# Patient Record
Sex: Male | Born: 1965 | Race: White | Hispanic: No | Marital: Married | Smoking: Never smoker
Health system: Southern US, Community
[De-identification: ages and names within clinical notes are randomized; demographics above are authoritative.]

## PROBLEM LIST (undated history)

## (undated) DIAGNOSIS — M5020 Other cervical disc displacement, unspecified cervical region: Secondary | ICD-10-CM

## (undated) DIAGNOSIS — M1990 Unspecified osteoarthritis, unspecified site: Secondary | ICD-10-CM

## (undated) DIAGNOSIS — E785 Hyperlipidemia, unspecified: Secondary | ICD-10-CM

## (undated) DIAGNOSIS — M109 Gout, unspecified: Secondary | ICD-10-CM

## (undated) DIAGNOSIS — K429 Umbilical hernia without obstruction or gangrene: Secondary | ICD-10-CM

## (undated) DIAGNOSIS — K8689 Other specified diseases of pancreas: Secondary | ICD-10-CM

## (undated) DIAGNOSIS — E119 Type 2 diabetes mellitus without complications: Secondary | ICD-10-CM

## (undated) DIAGNOSIS — B019 Varicella without complication: Secondary | ICD-10-CM

## (undated) HISTORY — DX: Hyperlipidemia, unspecified: E78.5

## (undated) HISTORY — DX: Type 2 diabetes mellitus without complications: E11.9

## (undated) HISTORY — DX: Unspecified osteoarthritis, unspecified site: M19.90

## (undated) HISTORY — PX: UPPER GASTROINTESTINAL ENDOSCOPY: SHX188

## (undated) HISTORY — DX: Gout, unspecified: M10.9

## (undated) HISTORY — DX: Varicella without complication: B01.9

## (undated) HISTORY — PX: COLONOSCOPY: SHX174

## (undated) HISTORY — DX: Other cervical disc displacement, unspecified cervical region: M50.20

---
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
## 2000-06-27 HISTORY — PX: CHOLECYSTECTOMY: SHX55

## 2004-07-27 ENCOUNTER — Ambulatory Visit: Payer: Self-pay

## 2006-06-28 ENCOUNTER — Ambulatory Visit: Payer: Self-pay | Admitting: Family Medicine

## 2006-06-30 ENCOUNTER — Ambulatory Visit: Payer: Self-pay | Admitting: Family Medicine

## 2006-07-18 ENCOUNTER — Ambulatory Visit: Payer: Self-pay | Admitting: Gastroenterology

## 2006-09-13 ENCOUNTER — Ambulatory Visit: Payer: Self-pay | Admitting: Gastroenterology

## 2006-09-26 ENCOUNTER — Ambulatory Visit: Payer: Self-pay | Admitting: Gastroenterology

## 2007-05-18 ENCOUNTER — Emergency Department: Payer: Self-pay | Admitting: Emergency Medicine

## 2007-05-29 ENCOUNTER — Emergency Department: Payer: Self-pay | Admitting: Emergency Medicine

## 2008-01-23 ENCOUNTER — Ambulatory Visit: Payer: Self-pay | Admitting: Orthopedic Surgery

## 2008-03-28 IMAGING — CT CT ABD-PELV W/ CM
1 of 2 series · 15 of 32 positions shown, 19 images · non-contrast
Comparison: none

REASON FOR EXAM: (1) diffuse pain with fever; (2) diffuse pain with fever
COMMENTS:

[Series 2: abd/pelvis · axial · 0.87mm/px · z∈[-546,-72]mm · 15 of 174 slices shown, 19 images]
[im 8/174  soft-tissue]
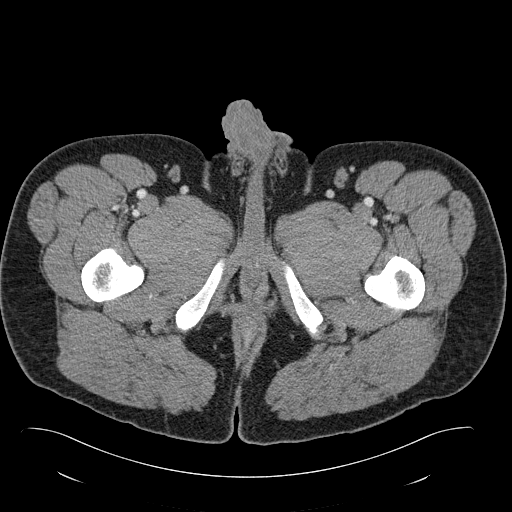
[im 8/174  bone]
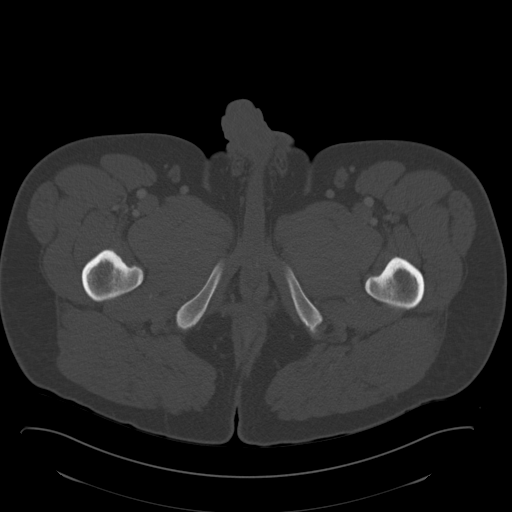
[im 23/174  soft-tissue]
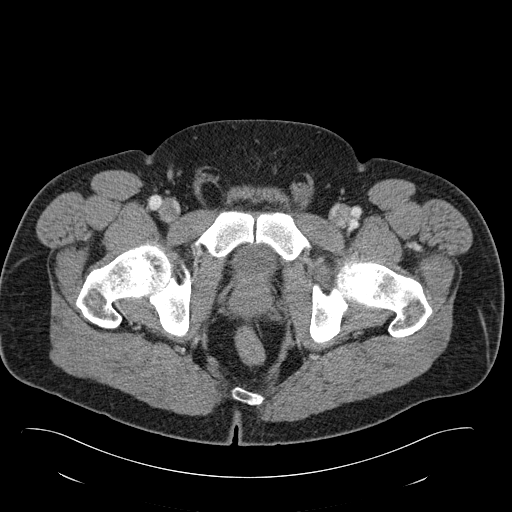
[im 38/174  soft-tissue]
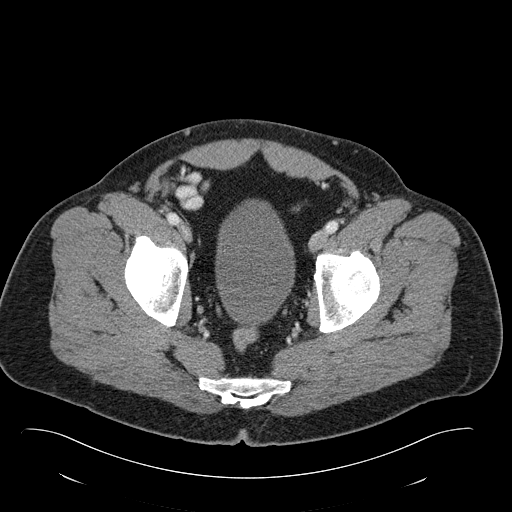
[im 46/174  soft-tissue]
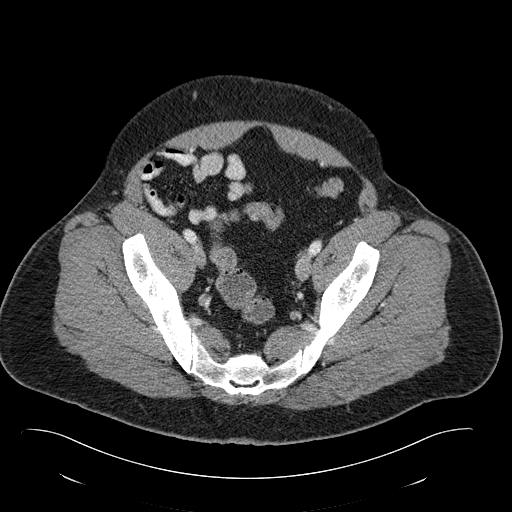
[im 61/174  soft-tissue]
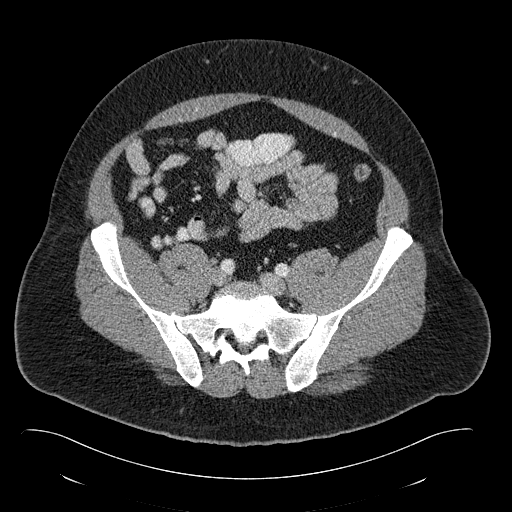
[im 76/174  soft-tissue]
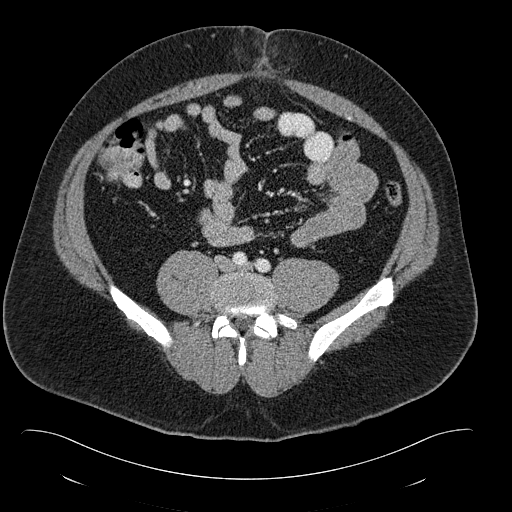
[im 91/174  soft-tissue]
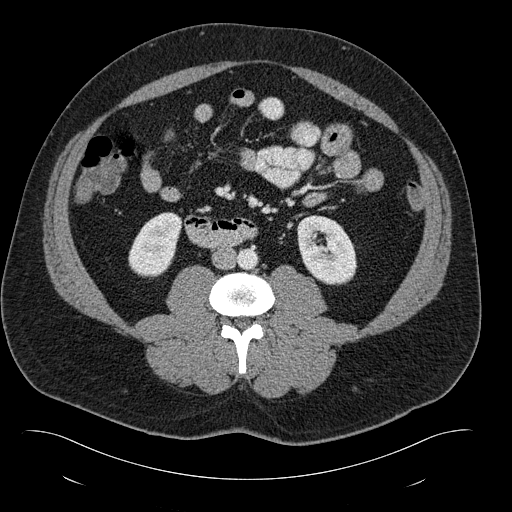
[im 98/174  soft-tissue]
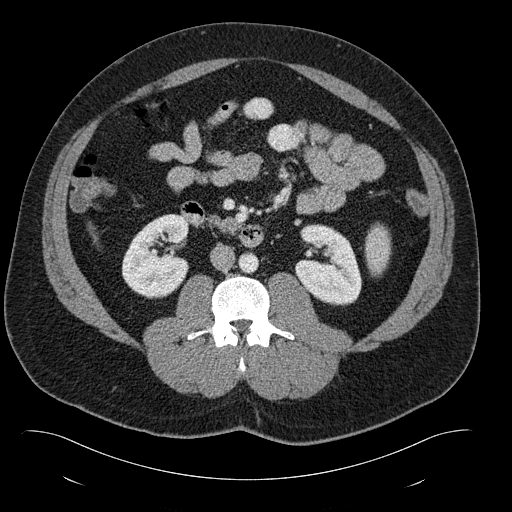
[im 113/174  soft-tissue]
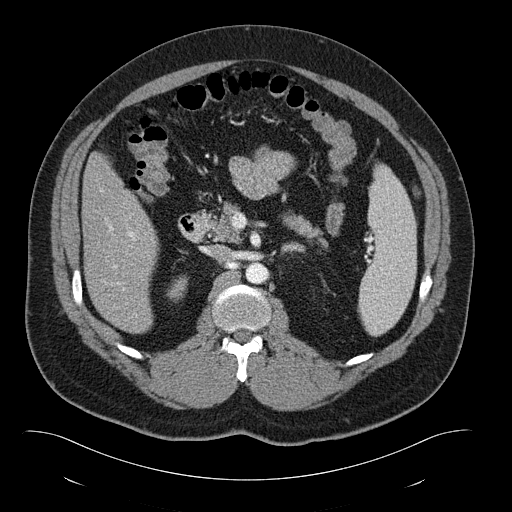
[im 113/174  bone]
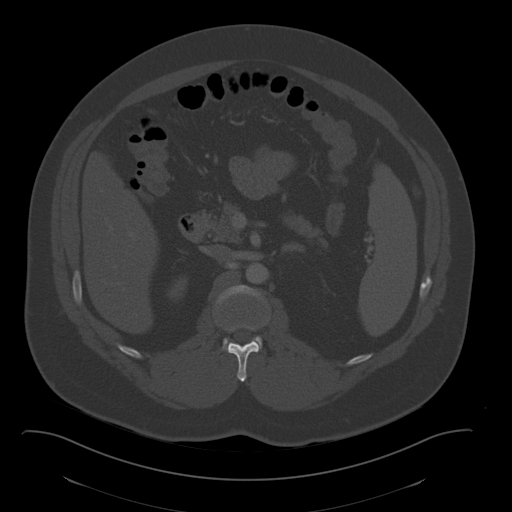
[im 128/174  soft-tissue]
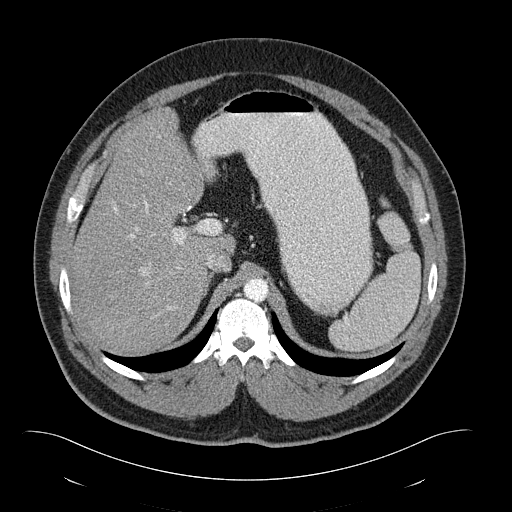
[im 136/174  soft-tissue]
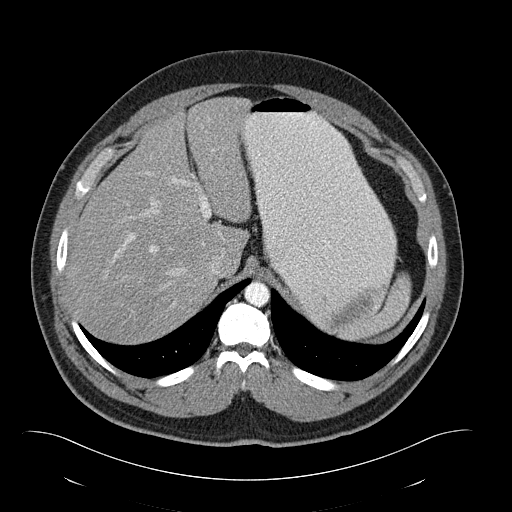
[im 143/174  lung]
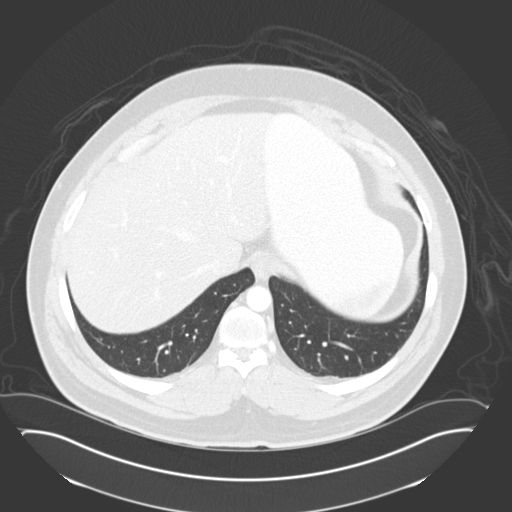
[im 151/174  soft-tissue]
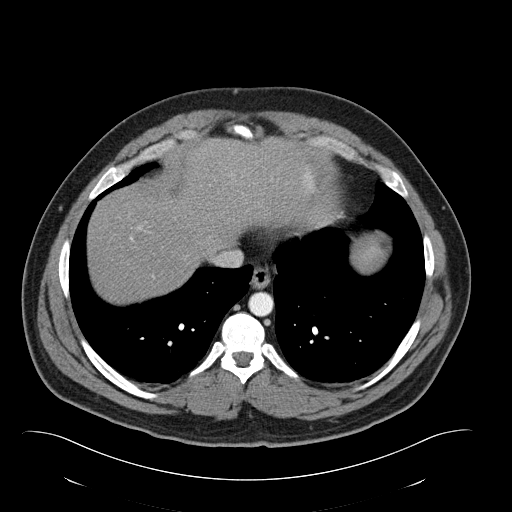
[im 151/174  lung]
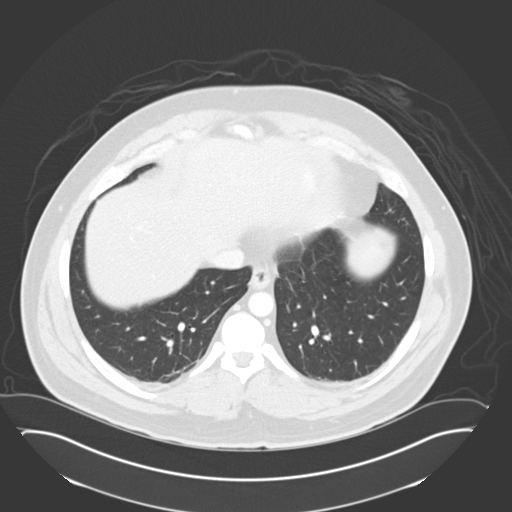
[im 158/174  lung]
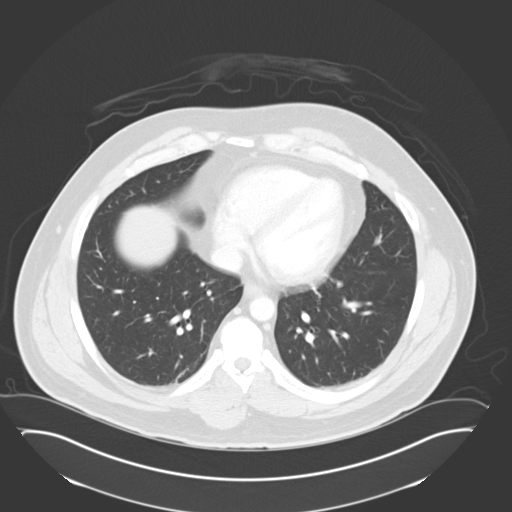
[im 166/174  soft-tissue]
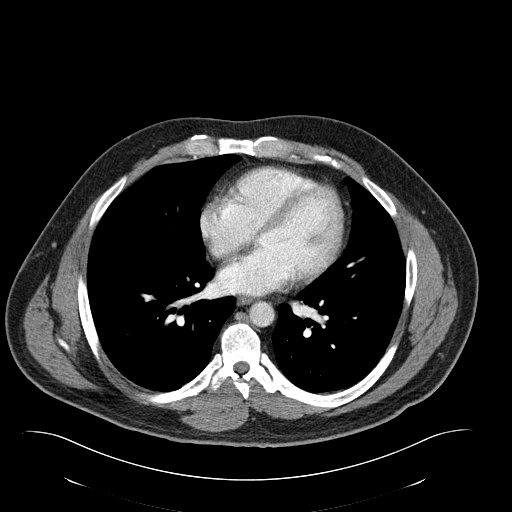
[im 166/174  lung]
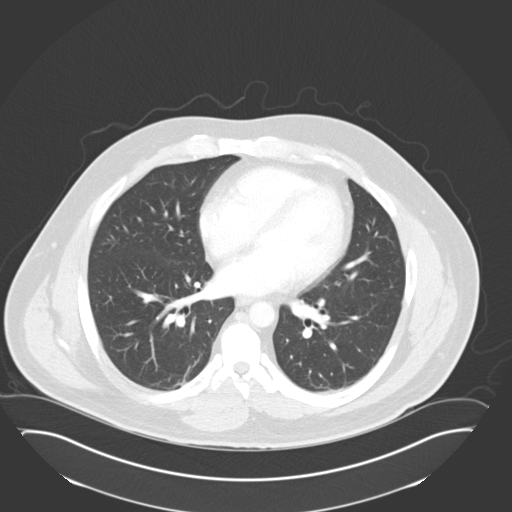

[15 of 32 positions shown; findings below may reference images not displayed]

PROCEDURE:     CT  - CT ABDOMEN / PELVIS  W  - May 30, 2007 [DATE]

RESULT:     Helical 3 mm sections were obtained from the lung bases through
the pubic symphysis status post intravenous administration of 125 ml of
Isovue 370 and oral contrast.

The lung bases demonstrate mild hypoventilation. (This study was compared to
a previous study dated 09-26-06).

The liver, spleen, adrenals, pancreas and kidneys are unremarkable. The
patient is status post cholecystectomy. The celiac, portal vein, SMV, SMA
and IMA are opacified. There is no evidence of an abdominal aortic aneurysm.
There is no evidence of abdominal or pelvic masses, free fluid, drainable
loculated fluid collections or adenopathy. A small fat containing umbilical
hernia is appreciated demonstrating a mild amount of standing.
IMPRESSION: 1.Small umbilical hernia. Otherwise, no evidence of focal or acute
abnormalities.  Specifically, there is no CT evidence of bowel obstruction,
diverticulitis, colitis, enteritis or appendicitis.

Dr. Leclerc of the Emergency Room was informed of these findings via a
preliminary faxed report on 05-30-07 at [DATE] Central Time.

## 2010-06-27 HISTORY — PX: BACK SURGERY: SHX140

## 2015-03-15 ENCOUNTER — Emergency Department
Admission: EM | Admit: 2015-03-15 | Discharge: 2015-03-15 | Disposition: A | Discharge status: Home / Self Care | Payer: Federal, State, Local not specified - PPO | Attending: Emergency Medicine | Admitting: Emergency Medicine

## 2015-03-15 ENCOUNTER — Emergency Department: Payer: Federal, State, Local not specified - PPO

## 2015-03-15 DIAGNOSIS — M10071 Idiopathic gout, right ankle and foot: Secondary | ICD-10-CM | POA: Insufficient documentation

## 2015-03-15 DIAGNOSIS — M109 Gout, unspecified: Secondary | ICD-10-CM

## 2015-03-15 DIAGNOSIS — M79671 Pain in right foot: Secondary | ICD-10-CM | POA: Diagnosis present

## 2015-03-15 DIAGNOSIS — E119 Type 2 diabetes mellitus without complications: Secondary | ICD-10-CM | POA: Diagnosis not present

## 2015-03-15 MED ORDER — KETOROLAC TROMETHAMINE 60 MG/2ML IM SOLN
60.0000 mg | Freq: Once | INTRAMUSCULAR | Status: AC
  Administered 2015-03-15: 60 mg via INTRAMUSCULAR
  Filled 2015-03-15: qty 2

## 2015-03-15 MED ORDER — HYDROCODONE-ACETAMINOPHEN 5-325 MG PO TABS
1.0000 | ORAL_TABLET | Freq: Once | ORAL | Status: AC
Start: 2015-03-15 — End: 2015-03-15
  Administered 2015-03-15: 1 via ORAL
  Filled 2015-03-15: qty 1

## 2015-03-15 MED ORDER — OXYCODONE-ACETAMINOPHEN 5-325 MG PO TABS
1.0000 | ORAL_TABLET | Freq: Once | ORAL | Status: AC
  Administered 2015-03-15: 1 via ORAL

## 2015-03-15 MED ORDER — PREDNISONE 20 MG PO TABS: 20.0000 mg | ORAL_TABLET | Freq: Every day | ORAL | Status: DC

## 2015-03-15 MED ORDER — PREDNISONE 20 MG PO TABS
20.0000 mg | ORAL_TABLET | Freq: Once | ORAL | Status: AC
  Administered 2015-03-15: 20 mg via ORAL
  Filled 2015-03-15: qty 1

## 2015-03-15 MED ORDER — OXYCODONE-ACETAMINOPHEN 5-325 MG PO TABS
ORAL_TABLET | ORAL | Status: AC
  Filled 2015-03-15: qty 1

## 2015-03-15 MED ORDER — OXYCODONE-ACETAMINOPHEN 5-325 MG PO TABS: 1.0000 | ORAL_TABLET | Freq: Four times a day (QID) | ORAL | Status: DC | PRN

## 2015-03-15 NOTE — ED Provider Notes (Signed)
[facility] Emergency Department Provider Note REMINDER - THIS NOTE IS NOT A FINAL MEDICAL RECORD UNTIL IT IS SIGNED. UNTIL THEN, THE CONTENT BELOW MAY REFLECT INFORMATION FROM A DOCUMENTATION TEMPLATE, NOT THE ACTUAL PATIENT VISIT. ____________________________________________  Time seen: Approximately 2:48 AM  I have reviewed the triage vital signs and the nursing notes.   HISTORY  Chief Complaint Foot Pain    HPI Jermaine Ford is a 49 y.o. male with medication control diabetes and gout. He reports that he began having a gout flare approximately 4 days ago for which she took colchicine due to pain in the right foot which improved his symptoms, but they have returned. He reports pain with walking in his right mid foot and right ankle. No numbness or tingling. No weakness. He denies fever or chills but does report that a low-grade temperature was noted at triage.   The patient reports that he has had very frequent gout flares, he is been experiencing increasing numbers and has been having one flare approximately every month now. This seems similar to previous flares.  No recent surgeries. No procedures to the right leg or ankle. No history of infection. Does not take any immune suppressants.   No past medical history on file. Diabetes Gout  There are no active problems to display for this patient.   No past surgical history on file.  Current Outpatient Rx  Name  Route  Sig  Dispense  Refill  . oxyCODONE-acetaminophen (ROXICET) 5-325 MG per tablet   Oral   Take 1 tablet by mouth every 6 (six) hours as needed for severe pain.   20 tablet   0   . predniSONE (DELTASONE) 20 MG tablet   Oral   Take 1 tablet (20 mg total) by mouth daily with breakfast.   5 tablet   0     Allergies Review of patient's allergies indicates no known allergies.  No family history on file.  Social History Social History  Substance Use Topics  . Smoking status: Not  on file  . Smokeless tobacco: Not on file  . Alcohol Use: Not on file    Review of Systems Constitutional: No fever/chills Eyes: No visual changes. ENT: No sore throat. Cardiovascular: Denies chest pain. Respiratory: Denies shortness of breath. Gastrointestinal: No abdominal pain.  No nausea, no vomiting.  No diarrhea.  No constipation. Genitourinary: Negative for dysuria. Musculoskeletal: Negative for back pain. Skin: Negative for rash. Neurological: Negative for headaches, focal weakness or numbness.  10-point ROS otherwise negative.  ____________________________________________   PHYSICAL EXAM:  VITAL SIGNS: ED Triage Vitals  Enc Vitals Group     BP 03/15/15 0051 139/76 mmHg     Pulse Rate 03/15/15 0051 94     Resp 03/15/15 0051 18     Temp 03/15/15 0051 100.3 F (37.9 C)     Temp Source 03/15/15 0051 Oral     SpO2 03/15/15 0051 95 %     Weight 03/15/15 0051 255 lb (115.667 kg)     Height 03/15/15 0051 5\' 10"  (1.778 m)     Head Cir --      Peak Flow --      Pain Score 03/15/15 0052 10     Pain Loc --      Pain Edu? --      Excl. in [place]? --    Constitutional: Alert and oriented. Well appearing and in no acute distress. Eyes: Conjunctivae are normal. PERRL. EOMI. Head: Atraumatic. Nose: No  congestion/rhinnorhea. Mouth/Throat: Mucous membranes are moist.  Oropharynx non-erythematous. Neck: No stridor.   Cardiovascular: Normal rate, regular rhythm. Grossly normal heart sounds.  Good peripheral circulation. Respiratory: Normal respiratory effort.  No retractions. Lungs CTAB. Gastrointestinal: Soft and nontender. No distention. No abdominal bruits. No CVA tenderness. Musculoskeletal: No lower extremity tenderness nor edema except as noted.  No joint effusions, though there is tenderness about the right dorsal mid foot and over the right ankle joint. There is no overlying erythema. The right ankle joint does seem slightly warm. Dorsalis pedis and posterior tibial pulses  and capillary refill are very normal in both extremities. The right knee and leg are otherwise normal. There is moderate tenderness to palpation of the right ankle. Neurologic:  Normal speech and language. No gross focal neurologic deficits are appreciated. Skin:  Skin is warm, dry and intact. No rash noted. Psychiatric: Mood and affect are normal. Speech and behavior are normal.  ____________________________________________   LABS (all labs ordered are listed, but only abnormal results are displayed)  Labs Reviewed - No data to display ____________________________________________  EKG   ____________________________________________  RADIOLOGY  IMPRESSION: Findings consistent with gout involving the first metatarsal phalangeal joint. Additionally there is mild hallux valgus and first MTP degenerative change. ____________________________________________   PROCEDURES  Procedure(s) performed: None   Review of risks and benefits of arthrocentesis of the right ankle joint to rule out a "infection". After discussion of the benefits and risks, patient and his wife both agree they do not wish to have arthrocentesis performed.  Critical Care performed: No  ____________________________________________   INITIAL IMPRESSION / ASSESSMENT AND PLAN / ED COURSE  Pertinent labs & imaging results that were available during my care of the patient were reviewed by me and considered in my medical decision making (see chart for details).  Patient presents with acute right ankle and midfoot pain. Neurovascular exam is normal. Patient does have warmth and tenderness isolated to the right dorsal mid foot but primarily seated upon the right ankle joint. Differential diagnosis certainly includes gout, septic joint, crystal arthropathy. No trauma.  I did discuss with the patient that although his symptoms seem most consistent with gout, we cannot completely exclude infection as a cause though he does  not have any frank reason to have an infected ankle joint (not on immunosuppressant (except chochicine which started taking after pain started), no recent surgery, etc.) though he does have a low-grade fever, but this can also be seen with gout flares. After discussing the risks and benefits of arthrocentesis and reviewing the basics of the procedure, with shared medical decision making we will not perform this but the patient will return to the emergency room and seek medical treatment right away if his symptoms are worsening, he develops higher fevers or shaking chills, notices redness or significant increase in swelling at the joint, or if other new concerns arise.  I will prescribe the patient a narcotic pain medicine due to their condition which I anticipate will cause at least moderate pain short term. I discussed with the patient safe use of narcotic pain medicines, and that they are not to drive, work in dangerous areas, or ever take more than prescribed (no more than 1 pill every 6 hours). We discussed that this is the type of medication that can be overdosed on and the risks of this type of medicine. Patient is very agreeable to only use as prescribed and to never use more than prescribed.  ----------------------------------------- 4:34 AM on 03/15/2015 -----------------------------------------  Patient reports pain improved. Very careful return precautions advised and I especially reinforced that we cannot exclude an infection of the right ankle, though given his history of frequent gout flares which are progressive in nature over the last several months it would seem most likely gout at this time. Patient wife both very agreeable and he'll return to the emergency room right away should his symptoms not be improving rapidly, if he develops high fevers or chills, increased swelling or pain, or other new concerns arise.  ____________________________________________   FINAL CLINICAL  IMPRESSION(S) / ED DIAGNOSES  Final diagnoses:  Acute gout of right ankle, unspecified cause      Delman Kitten, MD 03/15/15 [phone]

## 2015-03-15 NOTE — ED Notes (Signed)
Pt c/o pain to right foot since Saturday morning when he woke; denies injury; pain to outer mid foot radiating up to ankle;

## 2015-03-15 NOTE — Discharge Instructions (Signed)
As we discussed, given you have a long-standing history of gout would seem this is most likely due to a flare of your gout, but infection is a consideration. Should your symptoms worsen, you have noticed the joint becomes more red or swollen, you are not feeling improved after 24 hours of treatment, you have developed shaking chills, nausea, vomiting, a fever of 101F or greater, or other new concerns arise it would be very important to come back to the emergency room and consider having fluid removed from the ankle to rule out infection right away.  Please do not drive this evening or while taking Percocet. Follow up closely with the [facility] walk-in clinic or the emergency room in 1-2 days for reevaluation of your ankle and symptoms. I also strongly advise you to follow up with rheumatology as soon as possible to minimize your chances of recurrence.   Gout Gout is an inflammatory arthritis caused by a buildup of uric acid crystals in the joints. Uric acid is a chemical that is normally present in the blood. When the level of uric acid in the blood is too high it can form crystals that deposit in your joints and tissues. This causes joint redness, soreness, and swelling (inflammation). Repeat attacks are common. Over time, uric acid crystals can form into masses (tophi) near a joint, destroying bone and causing disfigurement. Gout is treatable and often preventable. CAUSES  The disease begins with elevated levels of uric acid in the blood. Uric acid is produced by your body when it breaks down a naturally found substance called purines. Certain foods you eat, such as meats and fish, contain high amounts of purines. Causes of an elevated uric acid level include:  Being passed down from parent to child (heredity).  Diseases that cause increased uric acid production (such as obesity, psoriasis, and certain cancers).  Excessive alcohol use.  Diet, especially diets rich in meat and seafood.  Medicines,  including certain cancer-fighting medicines (chemotherapy), water pills (diuretics), and aspirin.  Chronic kidney disease. The kidneys are no longer able to remove uric acid well.  Problems with metabolism. Conditions strongly associated with gout include:  Obesity.  High blood pressure.  High cholesterol.  Diabetes. Not everyone with elevated uric acid levels gets gout. It is not understood why some people get gout and others do not. Surgery, joint injury, and eating too much of certain foods are some of the factors that can lead to gout attacks. SYMPTOMS   An attack of gout comes on quickly. It causes intense pain with redness, swelling, and warmth in a joint.  Fever can occur.  Often, only one joint is involved. Certain joints are more commonly involved:  Base of the big toe.  Knee.  Ankle.  Wrist.  Finger. Without treatment, an attack usually goes away in a few days to weeks. Between attacks, you usually will not have symptoms, which is different from many other forms of arthritis. DIAGNOSIS  Your caregiver will suspect gout based on your symptoms and exam. In some cases, tests may be recommended. The tests may include:  Blood tests.  Urine tests.  X-rays.  Joint fluid exam. This exam requires a needle to remove fluid from the joint (arthrocentesis). Using a microscope, gout is confirmed when uric acid crystals are seen in the joint fluid. TREATMENT  There are two phases to gout treatment: treating the sudden onset (acute) attack and preventing attacks (prophylaxis).  Treatment of an Acute Attack.  Medicines are used. These include  anti-inflammatory medicines or steroid medicines.  An injection of steroid medicine into the affected joint is sometimes necessary.  The painful joint is rested. Movement can worsen the arthritis.  You may use warm or cold treatments on painful joints, depending which works best for you.  Treatment to Prevent Attacks.  If you  suffer from frequent gout attacks, your caregiver may advise preventive medicine. These medicines are started after the acute attack subsides. These medicines either help your kidneys eliminate uric acid from your body or decrease your uric acid production. You may need to stay on these medicines for a very long time.  The early phase of treatment with preventive medicine can be associated with an increase in acute gout attacks. For this reason, during the first few months of treatment, your caregiver may also advise you to take medicines usually used for acute gout treatment. Be sure you understand your caregiver's directions. Your caregiver may make several adjustments to your medicine dose before these medicines are effective.  Discuss dietary treatment with your caregiver or dietitian. Alcohol and drinks high in sugar and fructose and foods such as meat, poultry, and seafood can increase uric acid levels. Your caregiver or dietitian can advise you on drinks and foods that should be limited. HOME CARE INSTRUCTIONS   Do not take aspirin to relieve pain. This raises uric acid levels.  Only take over-the-counter or prescription medicines for pain, discomfort, or fever as directed by your caregiver.  Rest the joint as much as possible. When in bed, keep sheets and blankets off painful areas.  Keep the affected joint raised (elevated).  Apply warm or cold treatments to painful joints. Use of warm or cold treatments depends on which works best for you.  Use crutches if the painful joint is in your leg.  Drink enough fluids to keep your urine clear or pale yellow. This helps your body get rid of uric acid. Limit alcohol, sugary drinks, and fructose drinks.  Follow your dietary instructions. Pay careful attention to the amount of protein you eat. Your daily diet should emphasize fruits, vegetables, whole grains, and fat-free or low-fat milk products. Discuss the use of coffee, vitamin C, and cherries  with your caregiver or dietitian. These may be helpful in lowering uric acid levels.  Maintain a healthy body weight. SEEK MEDICAL CARE IF:   You develop diarrhea, vomiting, or any side effects from medicines.  You do not feel better in 24 hours, or you are getting worse. SEEK IMMEDIATE MEDICAL CARE IF:   Your joint becomes suddenly more tender, and you have chills or a fever. MAKE SURE YOU:   Understand these instructions.  Will watch your condition.  Will get help right away if you are not doing well or get worse. Document Released: 06/10/2000 Document Revised: 10/28/2013 Document Reviewed: 01/25/2012 [facility] Patient Information 2015 [place], [place]. This information is not intended to replace advice given to you by your health care provider. Make sure you discuss any questions you have with your health care provider.

## 2015-09-29 DIAGNOSIS — M109 Gout, unspecified: Secondary | ICD-10-CM | POA: Diagnosis not present

## 2016-01-04 ENCOUNTER — Ambulatory Visit (INDEPENDENT_AMBULATORY_CARE_PROVIDER_SITE_OTHER): Payer: Federal, State, Local not specified - PPO | Admitting: Family Medicine

## 2016-01-04 ENCOUNTER — Encounter: Payer: Self-pay | Admitting: Internal Medicine

## 2016-01-04 ENCOUNTER — Encounter: Payer: Self-pay | Admitting: Family Medicine

## 2016-01-04 VITALS — BP 130/76 | HR 63 | Temp 98.4°F | Ht 70.9 in | Wt 255.2 lb

## 2016-01-04 DIAGNOSIS — M1000 Idiopathic gout, unspecified site: Secondary | ICD-10-CM | POA: Insufficient documentation

## 2016-01-04 DIAGNOSIS — E119 Type 2 diabetes mellitus without complications: Secondary | ICD-10-CM

## 2016-01-04 DIAGNOSIS — Z130 Encounter for screening for diseases of the blood and blood-forming organs and certain disorders involving the immune mechanism: Secondary | ICD-10-CM | POA: Diagnosis not present

## 2016-01-04 DIAGNOSIS — M1A00X0 Idiopathic chronic gout, unspecified site, without tophus (tophi): Secondary | ICD-10-CM | POA: Diagnosis not present

## 2016-01-04 DIAGNOSIS — K429 Umbilical hernia without obstruction or gangrene: Secondary | ICD-10-CM

## 2016-01-04 DIAGNOSIS — Z1211 Encounter for screening for malignant neoplasm of colon: Secondary | ICD-10-CM | POA: Diagnosis not present

## 2016-01-04 DIAGNOSIS — R222 Localized swelling, mass and lump, trunk: Secondary | ICD-10-CM | POA: Diagnosis not present

## 2016-01-04 DIAGNOSIS — Z0001 Encounter for general adult medical examination with abnormal findings: Secondary | ICD-10-CM

## 2016-01-04 DIAGNOSIS — Z125 Encounter for screening for malignant neoplasm of prostate: Secondary | ICD-10-CM

## 2016-01-04 HISTORY — DX: Umbilical hernia without obstruction or gangrene: K42.9

## 2016-01-04 LAB — CBC
HCT: 44.4 % (ref 39.0–52.0)
HEMOGLOBIN: 15 g/dL (ref 13.0–17.0)
MCHC: 33.7 g/dL (ref 30.0–36.0)
MCV: 88 fl (ref 78.0–100.0)
PLATELETS: 218 10*3/uL (ref 150.0–400.0)
RBC: 5.04 Mil/uL (ref 4.22–5.81)
RDW: 13.3 % (ref 11.5–15.5)
WBC: 5.7 10*3/uL (ref 4.0–10.5)

## 2016-01-04 LAB — COMPREHENSIVE METABOLIC PANEL
ALK PHOS: 66 U/L (ref 39–117)
ALT: 20 U/L (ref 0–53)
AST: 10 U/L (ref 0–37)
Albumin: 4.3 g/dL (ref 3.5–5.2)
BILIRUBIN TOTAL: 1.2 mg/dL (ref 0.2–1.2)
BUN: 18 mg/dL (ref 6–23)
CALCIUM: 9.7 mg/dL (ref 8.4–10.5)
CO2: 29 mEq/L (ref 19–32)
Chloride: 103 mEq/L (ref 96–112)
Creatinine, Ser: 1.33 mg/dL (ref 0.40–1.50)
GFR: 60.07 mL/min (ref >60.00)
Glucose, Bld: 287 mg/dL — ABNORMAL HIGH (ref 70–99)
Potassium: 4.6 mEq/L (ref 3.5–5.1)
Sodium: 140 mEq/L (ref 135–145)
TOTAL PROTEIN: 6.6 g/dL (ref 6.0–8.3)

## 2016-01-04 LAB — PSA: PSA: 0.27 ng/mL (ref 0.10–4.00)

## 2016-01-04 LAB — URIC ACID: URIC ACID, SERUM: 8.9 mg/dL — AB (ref 4.0–7.8)

## 2016-01-04 LAB — LIPID PANEL
CHOL/HDL RATIO: 5
CHOLESTEROL: 178 mg/dL (ref 0–200)
HDL: 34.2 mg/dL — ABNORMAL LOW (ref >39.00)
LDL Cholesterol: 119 mg/dL — ABNORMAL HIGH (ref 0–99)
NonHDL: 143.3
TRIGLYCERIDES: 123 mg/dL (ref 0.0–149.0)
VLDL: 24.6 mg/dL (ref 0.0–40.0)

## 2016-01-04 LAB — HEMOGLOBIN A1C: Hgb A1c MFr Bld: 9.6 % — ABNORMAL HIGH (ref 4.6–6.5)

## 2016-01-04 NOTE — Progress Notes (Signed)
Subjective:  Patient ID: Jermaine Ford, male    DOB: [birth year]  Age: 50 y.o. MRN: [id number]  CC: Establish care, Knot on chest, DM  HPI Jermaine Ford is a 50 y.o. male presents to the clinic today to establish care.  Additional concerns are below.  Preventative Healthcare  Colonoscopy: In need of. Will place a referral.  Immunizations  Tetanus - 2012.  Pneumococcal - Candidate for.   Prostate cancer screening: PSA today.  Hepatitis C screening - Candidate for.  Labs: Screening labs today.  Exercise: No regular exercise.   Alcohol use: See below.   Smoking/tobacco use: Nonsmoker.   Regular dental exams: In need of dental exam.  Wears seat belt: Yes.   Knot, chest  Patient reports he's noticed a nodule/knot on the right side of his chest just lateral to the sternum.  He states it is been there for past 6 months.  Nontender.  No change in size.  No other associated symptoms.  DM  Blood sugars readings - Not checking.  Hypoglycemia - No.  Medications - Metformin.   Adverse effects - None.  Compliance - Yes.  Preventative care  Eye exam - Up to date.   Foot exam - In need of.  Last A1C - 7.1 (2 years ago).  Urine microalbumin - Unsure if this has been done.   PMH, Surgical Hx, Family Hx, Social History reviewed and updated as below.  Past Medical History  Diagnosis Date  . Chicken pox   . Diabetes mellitus without complication ([place])   . Arthritis   . Herniated cervical disc     C-5/C-6    C-6/C-7   Past Surgical History  Procedure Laterality Date  . Cholecystectomy  2002  . Back surgery  2012    herniated disk replaced.   Family History  Problem Relation Age of Onset  . Breast cancer Mother   . Diabetes Mother   . Hypertension Mother   . Arthritis Maternal Grandmother   . Hypertension Maternal Grandmother   . Diabetes Maternal Grandmother   . Lung cancer Paternal Grandmother    Social History  Substance Use Topics    . Smoking status: Never Smoker   . Smokeless tobacco: Never Used  . Alcohol Use: 0.0 - 0.6 oz/week    0-1 Standard drinks or equivalent per week   Review of Systems  HENT: Positive for tinnitus.   Eyes: Positive for visual disturbance.  Endocrine: Positive for polydipsia.  Musculoskeletal:       Muscle cramps.  Psychiatric/Behavioral:       Stress.  All other systems reviewed and are negative.  Objective:   Today's Vitals: BP 130/76 mmHg  Pulse 63  Temp(Src) 98.4 F (36.9 C) (Oral)  Ht 5' 10.9" (1.801 m)  Wt 255 lb 4 oz (115.781 kg)  BMI 35.70 kg/m2  SpO2 98%  Physical Exam  Constitutional: He is oriented to person, place, and time. He appears well-developed and well-nourished. No distress.  HENT:  Head: Normocephalic and atraumatic.  Nose: Nose normal.  Mouth/Throat: Oropharynx is clear and moist. No oropharyngeal exudate.  Normal TM's bilaterally.   Eyes: Conjunctivae are normal. No scleral icterus.  Neck: Neck supple. No thyromegaly present.  Cardiovascular: Normal rate and regular rhythm.   No murmur heard. Pulmonary/Chest: Effort normal and breath sounds normal. He has no wheezes. He has no rales.  Chest wall - right costochondral junction with prominence. No focal mass.  Abdominal: Soft. He exhibits no distension. There  is no tenderness. There is no rebound and no guarding.  Umbilical hernia noted (reducible).  Musculoskeletal: Normal range of motion. He exhibits no edema.  Lymphadenopathy:    He has no cervical adenopathy.  Neurological: He is alert and oriented to person, place, and time.  Skin:  Numerous nevi, some with color change.   Psychiatric: He has a normal mood and affect.  Vitals reviewed.  Diabetic Foot Check -  Appearance - no lesions, ulcers or calluses Skin - no unusual pallor or redness Monofilament testing -  Right - Great toe, medial, central, lateral ball and posterior foot intact Left - Great toe, medial, central, lateral ball and  posterior foot intact  Assessment & Plan:   Problem List Items Addressed This Visit    Encounter for preventative adult health care exam with abnormal findings - Primary    Tetanus up to date.  Placing referral for colonoscopy. Screening labs today including PSA. Will discuss pneumococcal vaccine at follow-up. Eye examine up to date. Foot exam performed today.      Umbilical hernia    Discussed referral for surgical consultation. Patient to contemplate.      Lump in chest    New problem. No indication for further workup based on exam. Will continue to monitor.      Type 2 diabetes mellitus without complication, without long-term current use of insulin (HCC)    Unsure control. Obtaining labs today. We'll continue metformin while awaiting labs.      Relevant Medications   metFORMIN (GLUCOPHAGE) 500 MG tablet   Other Relevant Orders   Comp Met (CMET)   HgB A1c   Lipid Profile   Primary gout   Relevant Orders   Uric acid    Other Visit Diagnoses    Screening for deficiency anemia        Relevant Orders    CBC    Screening for prostate cancer        Relevant Orders    PSA    Encounter for screening colonoscopy        Relevant Orders    Ambulatory referral to Gastroenterology       Outpatient Encounter Prescriptions as of 01/04/2016  Medication Sig  . allopurinol (ZYLOPRIM) 100 MG tablet Take 100 mg by mouth daily.  . colchicine 0.6 MG tablet Take 0.6 mg by mouth as needed.  . metFORMIN (GLUCOPHAGE) 500 MG tablet Take 500 mg by mouth once.  . [DISCONTINUED] oxyCODONE-acetaminophen (ROXICET) 5-325 MG per tablet Take 1 tablet by mouth every 6 (six) hours as needed for severe pain.  . [DISCONTINUED] predniSONE (DELTASONE) 20 MG tablet Take 1 tablet (20 mg total) by mouth daily with breakfast.   No facility-administered encounter medications on file as of 01/04/2016.    Follow-up: Return in about 6 months (around 07/06/2016) for Follow up Chronic medical  issues.  [place]

## 2016-01-04 NOTE — Progress Notes (Signed)
Pre visit review using our clinic review tool, if applicable. No additional management support is needed unless otherwise documented below in the visit note. 

## 2016-01-04 NOTE — Assessment & Plan Note (Signed)
Unsure control. Obtaining labs today. We'll continue metformin while awaiting labs.

## 2016-01-04 NOTE — Patient Instructions (Signed)
We will call with your results and with the referrals.  Follow up in 6 months.  Take care  Dr. Lacinda Axon   Health Maintenance, Male A healthy lifestyle and preventative care can promote health and wellness.  Maintain regular health, dental, and eye exams.  Eat a healthy diet. Foods like vegetables, fruits, whole grains, low-fat dairy products, and lean protein foods contain the nutrients you need and are low in calories. Decrease your intake of foods high in solid fats, added sugars, and salt. Get information about a proper diet from your health care provider, if necessary.  Regular physical exercise is one of the most important things you can do for your health. Most adults should get at least 150 minutes of moderate-intensity exercise (any activity that increases your heart rate and causes you to sweat) each week. In addition, most adults need muscle-strengthening exercises on 2 or more days a week.   Maintain a healthy weight. The body mass index (BMI) is a screening tool to identify possible weight problems. It provides an estimate of body fat based on height and weight. Your health care provider can find your BMI and can help you achieve or maintain a healthy weight. For males 20 years and older:  A BMI below 18.5 is considered underweight.  A BMI of 18.5 to 24.9 is normal.  A BMI of 25 to 29.9 is considered overweight.  A BMI of 30 and above is considered obese.  Maintain normal blood lipids and cholesterol by exercising and minimizing your intake of saturated fat. Eat a balanced diet with plenty of fruits and vegetables. Blood tests for lipids and cholesterol should begin at age 4 and be repeated every 5 years. If your lipid or cholesterol levels are high, you are over age 53, or you are at high risk for heart disease, you may need your cholesterol levels checked more frequently.Ongoing high lipid and cholesterol levels should be treated with medicines if diet and exercise are not  working.  If you smoke, find out from your health care provider how to quit. If you do not use tobacco, do not start.  Lung cancer screening is recommended for adults aged 52-80 years who are at high risk for developing lung cancer because of a history of smoking. A yearly low-dose CT scan of the lungs is recommended for people who have at least a 30-pack-year history of smoking and are current smokers or have quit within the past 15 years. A pack year of smoking is smoking an average of 1 pack of cigarettes a day for 1 year (for example, a 30-pack-year history of smoking could mean smoking 1 pack a day for 30 years or 2 packs a day for 15 years). Yearly screening should continue until the smoker has stopped smoking for at least 15 years. Yearly screening should be stopped for people who develop a health problem that would prevent them from having lung cancer treatment.  If you choose to drink alcohol, do not have more than 2 drinks per day. One drink is considered to be 12 oz (360 mL) of beer, 5 oz (150 mL) of wine, or 1.5 oz (45 mL) of liquor.  Avoid the use of street drugs. Do not share needles with anyone. Ask for help if you need support or instructions about stopping the use of drugs.  High blood pressure causes heart disease and increases the risk of stroke. High blood pressure is more likely to develop in:  People who have blood pressure  in the end of the normal range (100-139/85-89 mm Hg).  People who are overweight or obese.  People who are African American.  If you are 88-38 years of age, have your blood pressure checked every 3-5 years. If you are 68 years of age or older, have your blood pressure checked every year. You should have your blood pressure measured twice--once when you are at a hospital or clinic, and once when you are not at a hospital or clinic. Record the average of the two measurements. To check your blood pressure when you are not at a hospital or clinic, you can  use:  An automated blood pressure machine at a pharmacy.  A home blood pressure monitor.  If you are 56-3 years old, ask your health care provider if you should take aspirin to prevent heart disease.  Diabetes screening involves taking a blood sample to check your fasting blood sugar level. This should be done once every 3 years after age 50 if you are at a normal weight and without risk factors for diabetes. Testing should be considered at a younger age or be carried out more frequently if you are overweight and have at least 1 risk factor for diabetes.  Colorectal cancer can be detected and often prevented. Most routine colorectal cancer screening begins at the age of 70 and continues through age 33. However, your health care provider may recommend screening at an earlier age if you have risk factors for colon cancer. On a yearly basis, your health care provider may provide home test kits to check for hidden blood in the stool. A small camera at the end of a tube may be used to directly examine the colon (sigmoidoscopy or colonoscopy) to detect the earliest forms of colorectal cancer. Talk to your health care provider about this at age 9 when routine screening begins. A direct exam of the colon should be repeated every 5-10 years through age 32, unless early forms of precancerous polyps or small growths are found.  People who are at an increased risk for hepatitis B should be screened for this virus. You are considered at high risk for hepatitis B if:  You were born in a country where hepatitis B occurs often. Talk with your health care provider about which countries are considered high risk.  Your parents were born in a high-risk country and you have not received a shot to protect against hepatitis B (hepatitis B vaccine).  You have HIV or AIDS.  You use needles to inject street drugs.  You live with, or have sex with, someone who has hepatitis B.  You are a man who has sex with other  men (MSM).  You get hemodialysis treatment.  You take certain medicines for conditions like cancer, organ transplantation, and autoimmune conditions.  Hepatitis C blood testing is recommended for all people born from [birth year] through 1967 and any individual with known risk factors for hepatitis C.  Healthy men should no longer receive prostate-specific antigen (PSA) blood tests as part of routine cancer screening. Talk to your health care provider about prostate cancer screening.  Testicular cancer screening is not recommended for adolescents or adult males who have no symptoms. Screening includes self-exam, a health care provider exam, and other screening tests. Consult with your health care provider about any symptoms you have or any concerns you have about testicular cancer.  Practice safe sex. Use condoms and avoid high-risk sexual practices to reduce the spread of sexually transmitted infections (STIs).  You should be screened for STIs, including gonorrhea and chlamydia if:  You are sexually active and are younger than 24 years.  You are older than 24 years, and your health care provider tells you that you are at risk for this type of infection.  Your sexual activity has changed since you were last screened, and you are at an increased risk for chlamydia or gonorrhea. Ask your health care provider if you are at risk.  If you are at risk of being infected with HIV, it is recommended that you take a prescription medicine daily to prevent HIV infection. This is called pre-exposure prophylaxis (PrEP). You are considered at risk if:  You are a man who has sex with other men (MSM).  You are a heterosexual man who is sexually active with multiple partners.  You take drugs by injection.  You are sexually active with a partner who has HIV.  Talk with your health care provider about whether you are at high risk of being infected with HIV. If you choose to begin PrEP, you should first be tested  for HIV. You should then be tested every 3 months for as long as you are taking PrEP.  Use sunscreen. Apply sunscreen liberally and repeatedly throughout the day. You should seek shade when your shadow is shorter than you. Protect yourself by wearing long sleeves, pants, a wide-brimmed hat, and sunglasses year round whenever you are outdoors.  Tell your health care provider of new moles or changes in moles, especially if there is a change in shape or color. Also, tell your health care provider if a mole is larger than the size of a pencil eraser.  A one-time screening for abdominal aortic aneurysm (AAA) and surgical repair of large AAAs by ultrasound is recommended for men aged 26-75 years who are current or former smokers.  Stay current with your vaccines (immunizations).   This information is not intended to replace advice given to you by your health care provider. Make sure you discuss any questions you have with your health care provider.   Document Released: 12/10/2007 Document Revised: 07/04/2014 Document Reviewed: 11/08/2010 Elsevier Interactive Patient Education [employer].

## 2016-01-04 NOTE — Assessment & Plan Note (Signed)
Discussed referral for surgical consultation. Patient to contemplate.

## 2016-01-04 NOTE — Assessment & Plan Note (Signed)
Tetanus up to date.  Placing referral for colonoscopy. Screening labs today including PSA. Will discuss pneumococcal vaccine at follow-up. Eye examine up to date. Foot exam performed today.

## 2016-01-04 NOTE — Assessment & Plan Note (Signed)
New problem. No indication for further workup based on exam. Will continue to monitor.

## 2016-01-06 ENCOUNTER — Other Ambulatory Visit: Payer: Self-pay | Admitting: Family Medicine

## 2016-01-06 MED ORDER — ALLOPURINOL 300 MG PO TABS: 300.0000 mg | ORAL_TABLET | Freq: Every day | ORAL | Status: DC

## 2016-01-07 ENCOUNTER — Telehealth: Payer: Self-pay | Admitting: *Deleted

## 2016-01-07 NOTE — Telephone Encounter (Signed)
Spoke with patient and reviewed labs, see result note for details, thanks

## 2016-01-07 NOTE — Telephone Encounter (Signed)
Pt requested lab results from 01/04/16 Pt contact [phone]

## 2016-01-11 ENCOUNTER — Ambulatory Visit (INDEPENDENT_AMBULATORY_CARE_PROVIDER_SITE_OTHER): Payer: Federal, State, Local not specified - PPO | Admitting: Family Medicine

## 2016-01-11 VITALS — BP 128/74 | HR 73 | Temp 98.4°F | Wt 257.2 lb

## 2016-01-11 DIAGNOSIS — E119 Type 2 diabetes mellitus without complications: Secondary | ICD-10-CM | POA: Diagnosis not present

## 2016-01-11 DIAGNOSIS — E785 Hyperlipidemia, unspecified: Secondary | ICD-10-CM | POA: Insufficient documentation

## 2016-01-11 HISTORY — DX: Hyperlipidemia, unspecified: E78.5

## 2016-01-11 MED ORDER — EMPAGLIFLOZIN 10 MG PO TABS
10.0000 mg | ORAL_TABLET | Freq: Every day | ORAL | Status: DC
Start: 2016-01-11 — End: 2016-04-08

## 2016-01-11 MED ORDER — ATORVASTATIN CALCIUM 40 MG PO TABS: 40.0000 mg | ORAL_TABLET | Freq: Every day | ORAL | Status: DC

## 2016-01-11 MED ORDER — METFORMIN HCL 500 MG PO TABS: 500.0000 mg | ORAL_TABLET | Freq: Two times a day (BID) | ORAL | Status: DC

## 2016-01-11 MED ORDER — ASPIRIN 81 MG PO TABS: 81.0000 mg | ORAL_TABLET | Freq: Every day | ORAL | Status: DC

## 2016-01-11 NOTE — Assessment & Plan Note (Signed)
New problem. Discovered on screening labs. Given DM-2 and per guidelines, starting Lipitor 40 mg daily.

## 2016-01-11 NOTE — Assessment & Plan Note (Signed)
Uncontrolled. Increasing metformin (see meds/orders). Adding Jardiance. Advised daily aspirin 81 mg daily.

## 2016-01-11 NOTE — Progress Notes (Signed)
Pre visit review using our clinic review tool, if applicable. No additional management support is needed unless otherwise documented below in the visit note. 

## 2016-01-11 NOTE — Patient Instructions (Addendum)
It was nice to see you today.  Aspirin 81 mg daily.  Take the metformin as prescribed.  Start the [place]. Be sure to use the card.  Start the lipitor.  Lab visit in 1 month.  Follow up:  Return in about 3 months (around 04/12/2016) for Diabetes follow up.  Take care  Dr. Lacinda Axon

## 2016-01-11 NOTE — Progress Notes (Signed)
Subjective:  Patient ID: Jermaine Ford, male    DOB: [birth year]  Age: 50 y.o. MRN: [id number]  CC: Follow up DM-2, HLD  HPI:  50 year old male with DM 2 and hyperlipidemia presents for follow-up.  DM-2  Blood sugars readings - Not checking.  Hypoglycemia - No.  Medications - Metformin 500 mg daily.   Adverse effects - None.  Compliance - Yes.   Uncontrolled. Most recent A1C on 7/10 - 9.6  HLD  Discovered on recent screening labs.  Uncontrolled/not at goal.  Need to discuss treatment today.  Social Hx   Social History   Social History  . Marital Status: Married    Spouse Name: N/A  . Number of Children: N/A  . Years of Education: N/A   Social History Main Topics  . Smoking status: Never Smoker   . Smokeless tobacco: Never Used  . Alcohol Use: 0.0 - 0.6 oz/week    0-1 Standard drinks or equivalent per week  . Drug Use: No  . Sexual Activity: Not on file   Other Topics Concern  . Not on file   Social History Narrative  . No narrative on file   Review of Systems  Constitutional: Negative.   Respiratory: Negative.   Cardiovascular: Negative.    Objective:  BP 128/74 mmHg  Pulse 73  Temp(Src) 98.4 F (36.9 C) (Oral)  Wt 257 lb 4 oz (116.688 kg)  SpO2 97%  BP/Weight 01/11/2016 01/04/2016 [id number]  Systolic BP 0000000 [id number] 0000000  Diastolic BP 74 76 75  Wt. (Lbs) 257.25 255.25 255  BMI 35.97 35.7 36.59   Physical Exam  Constitutional: He is oriented to person, place, and time. He appears well-developed. No distress.  Cardiovascular: Normal rate and regular rhythm.   Pulmonary/Chest: Effort normal. He has no wheezes. He has no rales.  Neurological: He is alert and oriented to person, place, and time.  Psychiatric: He has a normal mood and affect.  Vitals reviewed.  Lab Results  Component Value Date   WBC 5.7 01/04/2016   HGB 15.0 01/04/2016   HCT 44.4 01/04/2016   PLT 218.0 01/04/2016   GLUCOSE 287* 01/04/2016   CHOL 178 01/04/2016   TRIG  123.0 01/04/2016   HDL 34.20* 01/04/2016   LDLCALC 119* 01/04/2016   ALT 20 01/04/2016   AST 10 01/04/2016   NA 140 01/04/2016   K 4.6 01/04/2016   CL 103 01/04/2016   CREATININE 1.33 01/04/2016   BUN 18 01/04/2016   CO2 29 01/04/2016   PSA 0.27 01/04/2016   HGBA1C 9.6* 01/04/2016   Assessment & Plan:   Problem List Items Addressed This Visit    Type 2 diabetes mellitus without complication, without long-term current use of insulin ([place]) - Primary    Uncontrolled. Increasing metformin (see meds/orders). Adding Jardiance. Advised daily aspirin 81 mg daily.       Relevant Medications   metFORMIN (GLUCOPHAGE) 500 MG tablet   atorvastatin (LIPITOR) 40 MG tablet   aspirin 81 MG tablet   empagliflozin (JARDIANCE) 10 MG TABS tablet   Hyperlipidemia    New problem. Discovered on screening labs. Given DM-2 and per guidelines, starting Lipitor 40 mg daily.       Relevant Medications   atorvastatin (LIPITOR) 40 MG tablet   aspirin 81 MG tablet      Meds ordered this encounter  Medications  . metFORMIN (GLUCOPHAGE) 500 MG tablet    Sig: Take 1 tablet (500 mg total) by mouth 2 (two) times daily  with a meal. After 1 week increase to 1000 mg twice daily.    Dispense:  360 tablet    Refill:  3  . atorvastatin (LIPITOR) 40 MG tablet    Sig: Take 1 tablet (40 mg total) by mouth daily.    Dispense:  90 tablet    Refill:  3  . aspirin 81 MG tablet    Sig: Take 1 tablet (81 mg total) by mouth daily.    Dispense:  90 tablet    Refill:  3  . empagliflozin (JARDIANCE) 10 MG TABS tablet    Sig: Take 10 mg by mouth daily.    Dispense:  90 tablet    Refill:  3    Follow-up: Return in about 3 months (around 04/12/2016) for Diabetes follow up.  [place]

## 2016-01-12 ENCOUNTER — Telehealth: Payer: Self-pay

## 2016-01-12 NOTE — Telephone Encounter (Signed)
PA for Jardiance completed on cover my meds, approved

## 2016-01-14 ENCOUNTER — Encounter: Payer: Self-pay | Admitting: Family Medicine

## 2016-01-15 ENCOUNTER — Other Ambulatory Visit: Payer: Self-pay | Admitting: Family Medicine

## 2016-01-15 MED ORDER — COLCHICINE 0.6 MG PO TABS: 0.6000 mg | ORAL_TABLET | ORAL | Status: DC | PRN

## 2016-02-02 ENCOUNTER — Ambulatory Visit (AMBULATORY_SURGERY_CENTER): Payer: Self-pay

## 2016-02-02 VITALS — Ht 70.0 in | Wt 251.6 lb

## 2016-02-02 DIAGNOSIS — Z1211 Encounter for screening for malignant neoplasm of colon: Secondary | ICD-10-CM

## 2016-02-02 MED ORDER — NA SULFATE-K SULFATE-MG SULF 17.5-3.13-1.6 GM/177ML PO SOLN: ORAL | 0 refills | Status: DC

## 2016-02-02 NOTE — Progress Notes (Signed)
Per pt, no allergies to soy or egg products.Pt not taking any weight loss meds or using  O2 at home. 

## 2016-02-25 ENCOUNTER — Ambulatory Visit (AMBULATORY_SURGERY_CENTER): Payer: Federal, State, Local not specified - PPO | Admitting: Internal Medicine

## 2016-02-25 ENCOUNTER — Encounter: Payer: Self-pay | Admitting: Internal Medicine

## 2016-02-25 VITALS — BP 121/79 | HR 60 | Temp 98.0°F | Resp 20 | Ht 70.0 in | Wt 251.0 lb

## 2016-02-25 DIAGNOSIS — D129 Benign neoplasm of anus and anal canal: Secondary | ICD-10-CM

## 2016-02-25 DIAGNOSIS — Z1211 Encounter for screening for malignant neoplasm of colon: Secondary | ICD-10-CM

## 2016-02-25 DIAGNOSIS — D128 Benign neoplasm of rectum: Secondary | ICD-10-CM | POA: Diagnosis not present

## 2016-02-25 DIAGNOSIS — D123 Benign neoplasm of transverse colon: Secondary | ICD-10-CM | POA: Diagnosis not present

## 2016-02-25 MED ORDER — SODIUM CHLORIDE 0.9 % IV SOLN: 500.0000 mL | INTRAVENOUS | Status: AC

## 2016-02-25 NOTE — Progress Notes (Signed)
Called to room to assist during endoscopic procedure.  Patient ID and intended procedure confirmed with present staff. Received instructions for my participation in the procedure from the performing physician.  

## 2016-02-25 NOTE — Progress Notes (Signed)
To recovery, report to [place], VSS. 

## 2016-02-25 NOTE — Op Note (Signed)
[facility] Patient Name: Jermaine Ford Procedure Date: 02/25/2016 8:00 AM MRN: [id number] Endoscopist: Jerene Bears , MD Age: 50 Referring MD:  Date of Birth: Aug 31, 1965 Gender: Male Account #: [id number] Procedure:                Colonoscopy Indications:              Screening for colorectal malignant neoplasm Medicines:                Monitored Anesthesia Care Procedure:                Pre-Anesthesia Assessment:                           - Prior to the procedure, a History and Physical                            was performed, and patient medications and                            allergies were reviewed. The patient's tolerance of                            previous anesthesia was also reviewed. The risks                            and benefits of the procedure and the sedation                            options and risks were discussed with the patient.                            All questions were answered, and informed consent                            was obtained. Prior Anticoagulants: The patient has                            taken no previous anticoagulant or antiplatelet                            agents. ASA Grade Assessment: II - A patient with                            mild systemic disease. After reviewing the risks                            and benefits, the patient was deemed in                            satisfactory condition to undergo the procedure.                           After obtaining informed consent, the colonoscope  was passed under direct vision. Throughout the                            procedure, the patient's blood pressure, pulse, and                            oxygen saturations were monitored continuously. The                            Model CF-HQ190L [id number]) scope was introduced                            through the anus and advanced to the the cecum,                            identified by  appendiceal orifice and ileocecal                            valve. The colonoscopy was performed without                            difficulty. The patient tolerated the procedure                            well. The quality of the bowel preparation was                            good. The ileocecal valve, appendiceal orifice, and                            rectum were photographed. Scope In: 8:05:38 AM Scope Out: 8:24:15 AM Scope Withdrawal Time: 0 hours 15 minutes 35 seconds  Total Procedure Duration: 0 hours 18 minutes 37 seconds  Findings:                 The perianal exam findings include skin tags.                           Three sessile polyps were found in the transverse                            colon. The polyps were 4 to 5 mm in size. These                            polyps were removed with a cold snare. Resection                            and retrieval were complete.                           A 12 mm polyp was found in the splenic flexure. The                            polyp was pedunculated. The  polyp was removed with                            a hot snare. Resection and retrieval were complete.                           A 5 mm polyp was found in the rectum. The polyp was                            sessile. The polyp was removed with a hot snare.                            Resection and retrieval were complete.                           Internal hemorrhoids were found during                            retroflexion. The hemorrhoids were small. Complications:            No immediate complications. Estimated Blood Loss:     Estimated blood loss was minimal. Impression:               - Three 4 to 5 mm polyps in the transverse colon,                            removed with a cold snare. Resected and retrieved.                           - One 12 mm polyp at the splenic flexure, removed                            with a hot snare. Resected and retrieved.                            - One 5 mm polyp in the rectum, removed with a hot                            snare. Resected and retrieved.                           - Internal hemorrhoids. Recommendation:           - Patient has a contact number available for                            emergencies. The signs and symptoms of potential                            delayed complications were discussed with the                            patient. Return to normal activities tomorrow.  Written discharge instructions were provided to the                            patient.                           - Resume previous diet.                           - Continue present medications.                           - Await pathology results.                           - Avoid aspirin, ibuprofen, naproxen, or other                            non-steroidal anti-inflammatory drugs for 2 weeks                            after polyp removal.                           - Repeat colonoscopy is recommended for                            surveillance of multiple polyps. The colonoscopy                            date will be determined after pathology results                            from today's exam become available for review. Jerene Bears, MD 02/25/2016 8:29:59 AM This report has been signed electronically.

## 2016-02-25 NOTE — Patient Instructions (Signed)
YOU HAD AN ENDOSCOPIC PROCEDURE TODAY AT [facility]:   Refer to the procedure report that was given to you for any specific questions about what was found during the examination.  If the procedure report does not answer your questions, please call your gastroenterologist to clarify.  If you requested that your care partner not be given the details of your procedure findings, then the procedure report has been included in a sealed envelope for you to review at your convenience later.  YOU SHOULD EXPECT: Some feelings of bloating in the abdomen. Passage of more gas than usual.  Walking can help get rid of the air that was put into your GI tract during the procedure and reduce the bloating. If you had a lower endoscopy (such as a colonoscopy or flexible sigmoidoscopy) you may notice spotting of blood in your stool or on the toilet paper. If you underwent a bowel prep for your procedure, you may not have a normal bowel movement for a few days.  Please Note:  You might notice some irritation and congestion in your nose or some drainage.  This is from the oxygen used during your procedure.  There is no need for concern and it should clear up in a day or so.  SYMPTOMS TO REPORT IMMEDIATELY:   Following lower endoscopy (colonoscopy or flexible sigmoidoscopy):  Excessive amounts of blood in the stool  Significant tenderness or worsening of abdominal pains  Swelling of the abdomen that is new, acute  Fever of 100F or higher   For urgent or emergent issues, a gastroenterologist can be reached at any hour by calling [phone].   DIET:  We do recommend a small meal at first, but then you may proceed to your regular diet.  Drink plenty of fluids but you should avoid alcoholic beverages for 24 hours.  ACTIVITY:  You should plan to take it easy for the rest of today and you should NOT DRIVE or use heavy machinery until tomorrow (because of the sedation medicines used during the test).     FOLLOW UP: Our staff will call the number listed on your records the next business day following your procedure to check on you and address any questions or concerns that you may have regarding the information given to you following your procedure. If we do not reach you, we will leave a message.  However, if you are feeling well and you are not experiencing any problems, there is no need to return our call.  We will assume that you have returned to your regular daily activities without incident.  If any biopsies were taken you will be contacted by phone or by letter within the next 1-3 weeks.  Please call us at [phone] if you have not heard about the biopsies in 3 weeks.    SIGNATURES/CONFIDENTIALITY: You and/or your care partner have signed paperwork which will be entered into your electronic medical record.  These signatures attest to the fact that that the information above on your After Visit Summary has been reviewed and is understood.  Full responsibility of the confidentiality of this discharge information lies with you and/or your care-partner.  Polyps, hemorrhoids-handouts given  Repeat colonoscopy will be determined by pathology.  No NSAIDs or aspirin products for 2 weeks.

## 2016-02-26 ENCOUNTER — Telehealth: Payer: Self-pay | Admitting: *Deleted

## 2016-02-26 NOTE — Telephone Encounter (Signed)
No answer, left message to call if questions or concerns. 

## 2016-03-03 ENCOUNTER — Encounter: Payer: Self-pay | Admitting: Internal Medicine

## 2016-04-06 ENCOUNTER — Other Ambulatory Visit: Payer: Self-pay | Admitting: Family Medicine

## 2016-04-06 NOTE — Telephone Encounter (Signed)
Refilled 01/06/16. Pt last seen 01/11/16. Please advise?

## 2016-04-07 MED ORDER — ALLOPURINOL 300 MG PO TABS: 300.0000 mg | ORAL_TABLET | Freq: Every day | ORAL | 0 refills | Status: DC

## 2016-04-08 ENCOUNTER — Encounter: Payer: Self-pay | Admitting: Family Medicine

## 2016-04-08 ENCOUNTER — Ambulatory Visit (INDEPENDENT_AMBULATORY_CARE_PROVIDER_SITE_OTHER): Payer: Federal, State, Local not specified - PPO | Admitting: Family Medicine

## 2016-04-08 VITALS — BP 110/70 | HR 72 | Temp 98.2°F | Wt 249.5 lb

## 2016-04-08 DIAGNOSIS — E785 Hyperlipidemia, unspecified: Secondary | ICD-10-CM | POA: Diagnosis not present

## 2016-04-08 DIAGNOSIS — E119 Type 2 diabetes mellitus without complications: Secondary | ICD-10-CM | POA: Diagnosis not present

## 2016-04-08 DIAGNOSIS — M1A00X0 Idiopathic chronic gout, unspecified site, without tophus (tophi): Secondary | ICD-10-CM | POA: Diagnosis not present

## 2016-04-08 LAB — LIPID PANEL
Cholesterol: 104 mg/dL (ref 0–200)
HDL: 35.5 mg/dL — AB (ref >39.00)
LDL Cholesterol: 46 mg/dL (ref 0–99)
NONHDL: 68.4
Total CHOL/HDL Ratio: 3
Triglycerides: 114 mg/dL (ref 0.0–149.0)
VLDL: 22.8 mg/dL (ref 0.0–40.0)

## 2016-04-08 LAB — COMPREHENSIVE METABOLIC PANEL
ALK PHOS: 51 U/L (ref 39–117)
ALT: 20 U/L (ref 0–53)
AST: 12 U/L (ref 0–37)
Albumin: 4.4 g/dL (ref 3.5–5.2)
BILIRUBIN TOTAL: 0.9 mg/dL (ref 0.2–1.2)
BUN: 15 mg/dL (ref 6–23)
CALCIUM: 9.3 mg/dL (ref 8.4–10.5)
CO2: 29 mEq/L (ref 19–32)
Chloride: 105 mEq/L (ref 96–112)
Creatinine, Ser: 1.23 mg/dL (ref 0.40–1.50)
GFR: 65.68 mL/min (ref >60.00)
Glucose, Bld: 143 mg/dL — ABNORMAL HIGH (ref 70–99)
POTASSIUM: 4.5 meq/L (ref 3.5–5.1)
Sodium: 141 mEq/L (ref 135–145)
TOTAL PROTEIN: 6.6 g/dL (ref 6.0–8.3)

## 2016-04-08 LAB — URIC ACID: URIC ACID, SERUM: 4.8 mg/dL (ref 4.0–7.8)

## 2016-04-08 LAB — MICROALBUMIN / CREATININE URINE RATIO
Creatinine,U: 124.5 mg/dL
Microalb Creat Ratio: 0.6 mg/g (ref 0.0–30.0)
Microalb, Ur: <0.7 mg/dL (ref 0.0–1.9)

## 2016-04-08 LAB — HEMOGLOBIN A1C: HEMOGLOBIN A1C: 6.8 % — AB (ref 4.6–6.5)

## 2016-04-08 MED ORDER — EMPAGLIFLOZIN 10 MG PO TABS: 10.0000 mg | ORAL_TABLET | Freq: Every day | ORAL | 3 refills | Status: DC

## 2016-04-08 NOTE — Patient Instructions (Signed)
You're doing well.  We will call with your lab results.  Follow up in 3 months.  Take care  Dr. Lacinda Axon

## 2016-04-08 NOTE — Assessment & Plan Note (Signed)
Stable. Uric acid today.

## 2016-04-08 NOTE — Progress Notes (Signed)
Pre visit review using our clinic review tool, if applicable. No additional management support is needed unless otherwise documented below in the visit note. 

## 2016-04-08 NOTE — Progress Notes (Signed)
Subjective:  Patient ID: Jermaine Ford, male    DOB: Sep 26, 1965  Age: 50 y.o. MRN: [id number]  CC: Follow up  HPI:  50 year old male with DM 2, HLD, gout presents for follow-up.  DM-2   Blood sugars readings - Improving. Fastings below 140 (mostly).  Hypoglycemia - No.  Medications - Metformin and Jardiance  Adverse effects - No.  Compliance - Yes.   HLD  Mild.  Stable.  Needs lipid panel today to assess.  Gout  Stable.  No recent flares.   Compliant with Allopurinol.  Needs labs.  Social Hx   Social History   Social History  . Marital status: Married    Spouse name: N/A  . Number of children: N/A  . Years of education: N/A   Social History Main Topics  . Smoking status: Never Smoker  . Smokeless tobacco: Never Used  . Alcohol use 0.0 - 0.6 oz/week     Comment: rarely  . Drug use: No  . Sexual activity: Not Asked   Other Topics Concern  . None   Social History Narrative  . None   Review of Systems  Constitutional: Negative.   Endocrine: Negative.    Objective:  BP 110/70 (BP Location: Right Arm, Patient Position: Sitting, Cuff Size: Large)   Pulse 72   Temp 98.2 F (36.8 C) (Oral)   Wt 249 lb 8 oz (113.2 kg)   SpO2 96%   BMI 35.80 kg/m   BP/Weight 04/08/2016 01/04/6268 10/02/5460  Systolic BP 703 500 -  Diastolic BP 70 79 -  Wt. (Lbs) 249.5 251 251.6  BMI 35.8 36.01 36.1   Physical Exam  Constitutional: He is oriented to person, place, and time. He appears well-developed. No distress.  Cardiovascular: Normal rate and regular rhythm.   Pulmonary/Chest: Effort normal.  Neurological: He is alert and oriented to person, place, and time.  Psychiatric: He has a normal mood and affect.  Vitals reviewed.  Lab Results  Component Value Date   WBC 5.7 01/04/2016   HGB 15.0 01/04/2016   HCT 44.4 01/04/2016   PLT 218.0 01/04/2016   GLUCOSE 287 (H) 01/04/2016   CHOL 178 01/04/2016   TRIG 123.0 01/04/2016   HDL 34.20 (L)  01/04/2016   LDLCALC 119 (H) 01/04/2016   ALT 20 01/04/2016   AST 10 01/04/2016   NA 140 01/04/2016   K 4.6 01/04/2016   CL 103 01/04/2016   CREATININE 1.33 01/04/2016   BUN 18 01/04/2016   CO2 29 01/04/2016   PSA 0.27 01/04/2016   HGBA1C 9.6 (H) 01/04/2016    Assessment & Plan:   Problem List Items Addressed This Visit    Type 2 diabetes mellitus without complication, without long-term current use of insulin ([place]) - Primary    Sugars improving. Continue metformin and Jardiance. Regarding his refill today. Urine microalbumin today. A1c today as well.      Relevant Medications   empagliflozin (JARDIANCE) 10 MG TABS tablet   Other Relevant Orders   Comp Met (CMET)   HgB A1c   Urine Microalbumin w/creat. ratio   Primary gout    Stable. Uric acid today.      Relevant Orders   Uric acid   Hyperlipidemia    Stable. Compliant with Lipitor. Lipid panel today.      Relevant Orders   Lipid Profile    Other Visit Diagnoses   None.     Meds ordered this encounter  Medications  . empagliflozin (JARDIANCE) 10 MG  TABS tablet    Sig: Take 10 mg by mouth daily.    Dispense:  90 tablet    Refill:  3    Follow-up: Return in about 3 months (around 07/09/2016) for Diabetes follow up.  [place]

## 2016-04-08 NOTE — Assessment & Plan Note (Signed)
Sugars improving. Continue metformin and Jardiance. Regarding his refill today. Urine microalbumin today. A1c today as well.

## 2016-04-08 NOTE — Assessment & Plan Note (Signed)
Stable. Compliant with Lipitor. Lipid panel today.

## 2016-04-11 ENCOUNTER — Ambulatory Visit: Payer: Federal, State, Local not specified - PPO | Admitting: Family Medicine

## 2016-07-06 ENCOUNTER — Ambulatory Visit (INDEPENDENT_AMBULATORY_CARE_PROVIDER_SITE_OTHER): Payer: Federal, State, Local not specified - PPO | Admitting: Family Medicine

## 2016-07-06 ENCOUNTER — Encounter: Payer: Self-pay | Admitting: Family Medicine

## 2016-07-06 VITALS — BP 108/68 | HR 70 | Temp 98.4°F | Resp 14 | Wt 248.6 lb

## 2016-07-06 DIAGNOSIS — R059 Cough, unspecified: Secondary | ICD-10-CM | POA: Insufficient documentation

## 2016-07-06 DIAGNOSIS — R05 Cough: Secondary | ICD-10-CM | POA: Diagnosis not present

## 2016-07-06 DIAGNOSIS — E785 Hyperlipidemia, unspecified: Secondary | ICD-10-CM

## 2016-07-06 DIAGNOSIS — M546 Pain in thoracic spine: Secondary | ICD-10-CM

## 2016-07-06 DIAGNOSIS — E119 Type 2 diabetes mellitus without complications: Secondary | ICD-10-CM

## 2016-07-06 MED ORDER — CYCLOBENZAPRINE HCL 10 MG PO TABS: 10.0000 mg | ORAL_TABLET | Freq: Every evening | ORAL | 0 refills | Status: DC | PRN

## 2016-07-06 MED ORDER — HYDROCOD POLST-CPM POLST ER 10-8 MG/5ML PO SUER: 5.0000 mL | Freq: Two times a day (BID) | ORAL | 0 refills | Status: DC | PRN

## 2016-07-06 NOTE — Assessment & Plan Note (Signed)
New acute problem. Exam unremarkable. Treating with Tussionex.

## 2016-07-06 NOTE — Assessment & Plan Note (Signed)
At goal. Continue metformin and Jardiance.

## 2016-07-06 NOTE — Assessment & Plan Note (Signed)
At goal. Continue Lipitor. 

## 2016-07-06 NOTE — Progress Notes (Signed)
Subjective:  Patient ID: Jermaine Ford, male    DOB: 09/27/1965  Age: 51 y.o. MRN: [id number]  CC: Follow up, Cough, Back pain  HPI:  51 year old male with DM 2, gout, hyperlipidemia presents for follow-up.  DM  Blood sugars readings - Well controlled/at goal. Fastings: 120s to 130s.  Hypoglycemia - No.  Medications - Metformin, Jardiance.  Adverse effects - None.  HLD  At goal on Lipitor.  Tolerating without difficulty.  Cough  Started Saturday.  Mildly productive.  No shortness of breath. No fever.  No other associated symptoms.  No known exacerbating or relieving factors.  Mild in severity.  Upper back pain  Patient reports a 3 to 4-week history of thoracic back pain.  Patient states that it starts at the base of the neck and goes down to the upper thoracic spine between the scapula.  Sharp in Scientist, research (physical sciences).  Some radiation to the left shoulder.  Worse at night.  No recent fall, trauma or injury.  No known exacerbating or relieving factors.  Social Hx   Social History   Social History  . Marital status: Married    Spouse name: N/A  . Number of children: N/A  . Years of education: N/A   Social History Main Topics  . Smoking status: Never Smoker  . Smokeless tobacco: Never Used  . Alcohol use 0.0 - 0.6 oz/week     Comment: rarely  . Drug use: No  . Sexual activity: Not Asked   Other Topics Concern  . None   Social History Narrative  . None   Review of Systems  Respiratory: Positive for cough.   Musculoskeletal: Positive for back pain.   Objective:  BP 108/68   Pulse 70   Temp 98.4 F (36.9 C) (Oral)   Resp 14   Wt 248 lb 9.6 oz (112.8 kg)   SpO2 97%   BMI 35.67 kg/m   BP/Weight 07/06/2016 04/08/2016 0000000  Systolic BP 123XX123 [id number] 123XX123  Diastolic BP 68 70 79  Wt. (Lbs) 248.6 249.5 251  BMI 35.67 35.8 36.01   Physical Exam  Constitutional: He is oriented to person, place, and time. He appears well-developed. No  distress.  HENT:  Mouth/Throat: Oropharynx is clear and moist.  Cardiovascular: Normal rate and regular rhythm.   Pulmonary/Chest: Effort normal and breath sounds normal. He has no wheezes. He has no rales.  Musculoskeletal:  Thoracic spine - tight paraspinal musculature of the upper thoracic spine bilaterally.  Neurological: He is alert and oriented to person, place, and time.  Psychiatric: He has a normal mood and affect.  Vitals reviewed.  Lab Results  Component Value Date   WBC 5.7 01/04/2016   HGB 15.0 01/04/2016   HCT 44.4 01/04/2016   PLT 218.0 01/04/2016   GLUCOSE 143 (H) 04/08/2016   CHOL 104 04/08/2016   TRIG 114.0 04/08/2016   HDL 35.50 (L) 04/08/2016   LDLCALC 46 04/08/2016   ALT 20 04/08/2016   AST 12 04/08/2016   NA 141 04/08/2016   K 4.5 04/08/2016   CL 105 04/08/2016   CREATININE 1.23 04/08/2016   BUN 15 04/08/2016   CO2 29 04/08/2016   PSA 0.27 01/04/2016   HGBA1C 6.8 (H) 04/08/2016   MICROALBUR <0.7 04/08/2016    Assessment & Plan:   Problem List Items Addressed This Visit    Type 2 diabetes mellitus without complication, without long-term current use of insulin ([place]) - Primary    At goal. Continue metformin and Jardiance.  Thoracic back pain    New acute problem. Appears muscular in origin. Likely related to spasm. Treating with Flexeril.      Relevant Medications   cyclobenzaprine (FLEXERIL) 10 MG tablet   Hyperlipidemia    At goal. Continue Lipitor.      Cough    New acute problem. Exam unremarkable. Treating with Tussionex.         Meds ordered this encounter  Medications  . cyclobenzaprine (FLEXERIL) 10 MG tablet    Sig: Take 1 tablet (10 mg total) by mouth at bedtime as needed for muscle spasms.    Dispense:  30 tablet    Refill:  0  . chlorpheniramine-HYDROcodone (TUSSIONEX PENNKINETIC ER) 10-8 MG/5ML SUER    Sig: Take 5 mLs by mouth every 12 (twelve) hours as needed.    Dispense:  115 mL    Refill:  0     Follow-up: No Follow-up on file.  [place]

## 2016-07-06 NOTE — Progress Notes (Signed)
Pre visit review using our clinic review tool, if applicable. No additional management support is needed unless otherwise documented below in the visit note. 

## 2016-07-06 NOTE — Patient Instructions (Signed)
Continue your medications.  Use the flexeril at night for your back.  Cough medication as needed.  Follow up in 6 months.  Take care  Dr. Lacinda Axon

## 2016-07-06 NOTE — Assessment & Plan Note (Signed)
New acute problem. Appears muscular in origin. Likely related to spasm. Treating with Flexeril.

## 2016-07-21 ENCOUNTER — Other Ambulatory Visit: Payer: Self-pay | Admitting: Family Medicine

## 2016-07-25 ENCOUNTER — Ambulatory Visit (INDEPENDENT_AMBULATORY_CARE_PROVIDER_SITE_OTHER): Payer: Federal, State, Local not specified - PPO | Admitting: Family

## 2016-07-25 ENCOUNTER — Ambulatory Visit: Payer: Federal, State, Local not specified - PPO | Admitting: Family Medicine

## 2016-07-25 ENCOUNTER — Encounter: Payer: Self-pay | Admitting: Family

## 2016-07-25 VITALS — BP 122/88 | HR 83 | Temp 98.6°F | Ht 70.0 in | Wt 241.2 lb

## 2016-07-25 DIAGNOSIS — R059 Cough, unspecified: Secondary | ICD-10-CM

## 2016-07-25 DIAGNOSIS — R05 Cough: Secondary | ICD-10-CM | POA: Diagnosis not present

## 2016-07-25 LAB — POCT INFLUENZA A/B
INFLUENZA B, POC: NEGATIVE
Influenza A, POC: NEGATIVE

## 2016-07-25 MED ORDER — BENZONATATE 100 MG PO CAPS: 100.0000 mg | ORAL_CAPSULE | Freq: Three times a day (TID) | ORAL | 1 refills | Status: DC | PRN

## 2016-07-25 NOTE — Patient Instructions (Signed)
As discussed, and Mucinex 1200 mg twice a day. Please ensure you're taking his medications with lots of water. Cough medication as needed. Let me know if you're not better.  Increase intake of clear fluids. Congestion is best treated by hydration, when mucus is wetter, it is thinner, less sticky, and easier to expel from the body, either through coughing up drainage, or by blowing your nose.   Get plenty of rest.   Use saline nasal drops and blow your nose frequently. Run a humidifier at night and elevate the head of the bed. Vicks Vapor rub will help with congestion and cough. Steam showers and sinus massage for congestion.   Use Acetaminophen or Ibuprofen as needed for fever or pain. Avoid second hand smoke. Even the smallest exposure will worsen symptoms.    You can also try a teaspoon of honey to see if this will help reduce cough. Throat lozenges can sometimes be beneficial as well.    This illness will typically last 7 - 10 days.   Please follow up with our clinic if you develop a fever greater than 101 F, symptoms worsen, or do not resolve in the next week.

## 2016-07-25 NOTE — Progress Notes (Signed)
Subjective:    Patient ID: Jermaine Ford, male    DOB: [birth year], 51 y.o.   MRN: [id number]  CC: Jermaine Ford is a 51 y.o. male who presents today for an acute visit.    HPI: Chief complaint; fever, body aches, cough x 5 days ago. Tmax 101.3 , HA, and ear pain 3 days ago, resolved. Tried mucinex, theraflu with some relief. No wheezing, SOB, vision changes.   No lung disease. Nonsmoker Had OV 1/10 and cough resolved at that time with tussionex. New cough.     HISTORY:  Past Medical History:  Diagnosis Date  . Arthritis   . Chicken pox   . Diabetes mellitus without complication ([place])   . Gout   . Herniated cervical disc    C-5/C-6    C-6/C-7  . Hyperlipidemia   . Hyperlipidemia 01/11/2016   Past Surgical History:  Procedure Laterality Date  . BACK SURGERY  2012   herniated disk replaced./C5-C7  . CHOLECYSTECTOMY  2002  . COLONOSCOPY    . UPPER GASTROINTESTINAL ENDOSCOPY     Family History  Problem Relation Age of Onset  . Breast cancer Mother   . Diabetes Mother   . Hypertension Mother   . Arthritis Maternal Grandmother   . Hypertension Maternal Grandmother   . Diabetes Maternal Grandmother   . Lung cancer Paternal Grandmother   . Prostate cancer Paternal Uncle   . Colon cancer Neg Hx   . Esophageal cancer Neg Hx   . Pancreatic cancer Neg Hx   . Rectal cancer Neg Hx   . Stomach cancer Neg Hx     Allergies: Patient has no known allergies. Current Outpatient Prescriptions on File Prior to Visit  Medication Sig Dispense Refill  . allopurinol (ZYLOPRIM) 300 MG tablet TAKE 1 TABLET BY MOUTH DAILY 90 tablet 0  . aspirin 81 MG tablet Take 1 tablet (81 mg total) by mouth daily. 90 tablet 3  . atorvastatin (LIPITOR) 40 MG tablet Take 1 tablet (40 mg total) by mouth daily. 90 tablet 3  . chlorpheniramine-HYDROcodone (TUSSIONEX PENNKINETIC ER) 10-8 MG/5ML SUER Take 5 mLs by mouth every 12 (twelve) hours as needed. 115 mL 0  . colchicine 0.6 MG tablet Take 1 tablet  (0.6 mg total) by mouth as needed. (Patient taking differently: Take 0.5 mg by mouth as needed. ) 90 tablet 0  . cyclobenzaprine (FLEXERIL) 10 MG tablet Take 1 tablet (10 mg total) by mouth at bedtime as needed for muscle spasms. 30 tablet 0  . empagliflozin (JARDIANCE) 10 MG TABS tablet Take 10 mg by mouth daily. 90 tablet 3  . metFORMIN (GLUCOPHAGE) 500 MG tablet Take 1 tablet (500 mg total) by mouth 2 (two) times daily with a meal. After 1 week increase to 1000 mg twice daily. 360 tablet 3   Current Facility-Administered Medications on File Prior to Visit  Medication Dose Route Frequency Provider Last Rate Last Dose  . 0.9 %  sodium chloride infusion  500 mL Intravenous Continuous Jerene Bears, MD        Social History  Substance Use Topics  . Smoking status: Never Smoker  . Smokeless tobacco: Never Used  . Alcohol use 0.0 - 0.6 oz/week     Comment: rarely    Review of Systems  Constitutional: Positive for fever (resolved). Negative for chills.  HENT: Positive for congestion and ear pain. Negative for sore throat.   Eyes: Negative for visual disturbance.  Respiratory: Positive for cough. Negative for  shortness of breath and wheezing.   Cardiovascular: Negative for chest pain and palpitations.  Gastrointestinal: Negative for nausea and vomiting.  Musculoskeletal: Positive for myalgias.  Neurological: Positive for headaches (resolved).      Objective:    BP 122/88   Pulse 83   Temp 98.6 F (37 C) (Oral)   Ht 5\' 10"  (1.778 m)   Wt 241 lb 3.2 oz (109.4 kg)   SpO2 94%   BMI 34.61 kg/m    Physical Exam  Constitutional: Vital signs are normal. He appears well-developed and well-nourished.  HENT:  Head: Normocephalic and atraumatic.  Right Ear: Hearing, tympanic membrane, external ear and ear canal normal. No drainage, swelling or tenderness. Tympanic membrane is not injected, not erythematous and not bulging. No middle ear effusion. No decreased hearing is noted.  Left Ear:  Hearing, tympanic membrane, external ear and ear canal normal. No drainage, swelling or tenderness. Tympanic membrane is not injected, not erythematous and not bulging.  No middle ear effusion. No decreased hearing is noted.  Nose: Rhinorrhea present. Right sinus exhibits no maxillary sinus tenderness and no frontal sinus tenderness. Left sinus exhibits no maxillary sinus tenderness and no frontal sinus tenderness.  Mouth/Throat: Uvula is midline, oropharynx is clear and moist and mucous membranes are normal. No oropharyngeal exudate, posterior oropharyngeal edema, posterior oropharyngeal erythema or tonsillar abscesses.  Eyes: Conjunctivae are normal.  Cardiovascular: Regular rhythm and normal heart sounds.   Pulmonary/Chest: Effort normal and breath sounds normal. No respiratory distress. He has no wheezes. He has no rhonchi. He has no rales.  Lymphadenopathy:       Head (right side): No submental, no submandibular, no tonsillar, no preauricular, no posterior auricular and no occipital adenopathy present.       Head (left side): No submental, no submandibular, no tonsillar, no preauricular, no posterior auricular and no occipital adenopathy present.    He has no cervical adenopathy.  Neurological: He is alert.  Skin: Skin is warm and dry.  Psychiatric: He has a normal mood and affect. His speech is normal and behavior is normal.  Vitals reviewed.      Assessment & Plan:  1. Cough Neg flu. Aefebrile.Outside of window for tamiflu. New infection; prior infection this past month had resolved per wife and patient. Declined CXR. PRN cough medication. Return precautions.   - POCT Influenza A/B - benzonatate (TESSALON PERLES) 100 MG capsule; Take 1 capsule (100 mg total) by mouth 3 (three) times daily as needed for cough.  Dispense: 30 capsule; Refill: 1     I am having Mr. Monahan maintain his metFORMIN, atorvastatin, aspirin, colchicine, empagliflozin, cyclobenzaprine,  chlorpheniramine-HYDROcodone, and allopurinol. We will continue to administer sodium chloride.   No orders of the defined types were placed in this encounter.   Return precautions given.   Risks, benefits, and alternatives of the medications and treatment plan prescribed today were discussed, and patient expressed understanding.   Education regarding symptom management and diagnosis given to patient on AVS.  Continue to follow with Coral Spikes, DO for routine health maintenance.   Benjaman Pott and I agreed with plan.   Mable Paris, FNP

## 2016-07-25 NOTE — Progress Notes (Signed)
Pre visit review using our clinic review tool, if applicable. No additional management support is needed unless otherwise documented below in the visit note. 

## 2016-10-19 ENCOUNTER — Other Ambulatory Visit: Payer: Self-pay | Admitting: Family Medicine

## 2016-10-20 NOTE — Telephone Encounter (Signed)
Refilled:  07/21/16 Last OV: 07/06/16 Last Labs: 04/08/16 Future OV: 01/03/17 Please advise?

## 2017-01-03 ENCOUNTER — Ambulatory Visit: Payer: Federal, State, Local not specified - PPO | Admitting: Family Medicine

## 2017-01-31 ENCOUNTER — Other Ambulatory Visit: Payer: Self-pay | Admitting: Family Medicine

## 2017-02-01 ENCOUNTER — Encounter: Payer: Self-pay | Admitting: Family Medicine

## 2017-02-01 ENCOUNTER — Other Ambulatory Visit: Payer: Self-pay | Admitting: Family Medicine

## 2017-02-01 ENCOUNTER — Ambulatory Visit (INDEPENDENT_AMBULATORY_CARE_PROVIDER_SITE_OTHER): Payer: Federal, State, Local not specified - PPO | Admitting: Family Medicine

## 2017-02-01 VITALS — BP 116/70 | HR 68 | Temp 98.7°F | Resp 16 | Wt 255.2 lb

## 2017-02-01 DIAGNOSIS — E119 Type 2 diabetes mellitus without complications: Secondary | ICD-10-CM

## 2017-02-01 DIAGNOSIS — E785 Hyperlipidemia, unspecified: Secondary | ICD-10-CM | POA: Diagnosis not present

## 2017-02-01 DIAGNOSIS — Z130 Encounter for screening for diseases of the blood and blood-forming organs and certain disorders involving the immune mechanism: Secondary | ICD-10-CM

## 2017-02-01 DIAGNOSIS — M546 Pain in thoracic spine: Secondary | ICD-10-CM | POA: Diagnosis not present

## 2017-02-01 LAB — COMPREHENSIVE METABOLIC PANEL
ALBUMIN: 4.5 g/dL (ref 3.5–5.2)
ALT: 32 U/L (ref 0–53)
AST: 15 U/L (ref 0–37)
Alkaline Phosphatase: 63 U/L (ref 39–117)
BILIRUBIN TOTAL: 0.7 mg/dL (ref 0.2–1.2)
BUN: 18 mg/dL (ref 6–23)
CALCIUM: 9.5 mg/dL (ref 8.4–10.5)
CHLORIDE: 105 meq/L (ref 96–112)
CO2: 29 meq/L (ref 19–32)
Creatinine, Ser: 1.3 mg/dL (ref 0.40–1.50)
GFR: 61.42 mL/min (ref >60.00)
Glucose, Bld: 189 mg/dL — ABNORMAL HIGH (ref 70–99)
Potassium: 4.8 mEq/L (ref 3.5–5.1)
Sodium: 139 mEq/L (ref 135–145)
Total Protein: 7.1 g/dL (ref 6.0–8.3)

## 2017-02-01 LAB — LIPID PANEL
CHOL/HDL RATIO: 4
CHOLESTEROL: 157 mg/dL (ref 0–200)
HDL: 38.8 mg/dL — AB (ref >39.00)
LDL CALC: 92 mg/dL (ref 0–99)
NonHDL: 118.39
TRIGLYCERIDES: 130 mg/dL (ref 0.0–149.0)
VLDL: 26 mg/dL (ref 0.0–40.0)

## 2017-02-01 LAB — MICROALBUMIN / CREATININE URINE RATIO
Creatinine,U: 107.6 mg/dL
MICROALB/CREAT RATIO: 0.7 mg/g (ref 0.0–30.0)
Microalb, Ur: 0.7 mg/dL (ref 0.0–1.9)

## 2017-02-01 LAB — CBC
HCT: 46.2 % (ref 39.0–52.0)
Hemoglobin: 15.1 g/dL (ref 13.0–17.0)
MCHC: 32.7 g/dL (ref 30.0–36.0)
MCV: 90.8 fl (ref 78.0–100.0)
PLATELETS: 237 10*3/uL (ref 150.0–400.0)
RBC: 5.09 Mil/uL (ref 4.22–5.81)
RDW: 14.1 % (ref 11.5–15.5)
WBC: 5.6 10*3/uL (ref 4.0–10.5)

## 2017-02-01 LAB — HEMOGLOBIN A1C: HEMOGLOBIN A1C: 7.9 % — AB (ref 4.6–6.5)

## 2017-02-01 MED ORDER — TRAMADOL HCL 50 MG PO TABS: 50.0000 mg | ORAL_TABLET | Freq: Three times a day (TID) | ORAL | 0 refills | Status: DC | PRN

## 2017-02-01 MED ORDER — CYCLOBENZAPRINE HCL 10 MG PO TABS: 10.0000 mg | ORAL_TABLET | Freq: Every evening | ORAL | 0 refills | Status: DC | PRN

## 2017-02-01 NOTE — Patient Instructions (Signed)
Use the tramadol as needed. Flexeril at night.  Continue your other medications.  We will call with your lab results.  Follow up in 3-6 months.   Take care  Dr. Lacinda Axon

## 2017-02-01 NOTE — Progress Notes (Signed)
Subjective:  Patient ID: Jermaine Ford, male    DOB: Nov 21, 1965  Age: 51 y.o. MRN: [id number]  CC: Follow up; complains of back pain  HPI:  51 year old male with type 2 diabetes, hyperlipidemia, gout presents for follow-up. He also complains of back pain.  DM 2  Has not had an A1c this year. Last A1c was 6.8.  He states that his blood sugars are predominantly between 114 and 150. His blood sugar was 178 yesterday fasting.  He is on metformin and Jardiance.  Hyperlipidemia  Has been stable on Lipitor. Needs lipid panel.  Back pain  2 weeks.  Located in the upper back around the left scapula.  Pain is severe.  Worse with activity/movement.  No medications or interventions tried.  No known relieving factors.  No recent fall, trauma, injury. No other associated symptoms. No other complaints at this time.  Social Hx   Social History   Social History  . Marital status: Married    Spouse name: N/A  . Number of children: N/A  . Years of education: N/A   Occupational History  .  [employer]   Social History Main Topics  . Smoking status: Never Smoker  . Smokeless tobacco: Never Used  . Alcohol use 0.0 - 0.6 oz/week     Comment: rarely  . Drug use: No  . Sexual activity: Not Asked   Other Topics Concern  . None   Social History Narrative  . None    Review of Systems  Constitutional: Negative.   Musculoskeletal: Positive for back pain.   Objective:  BP 116/70 (BP Location: Left Arm, Patient Position: Sitting, Cuff Size: Normal)   Pulse 68   Temp 98.7 F (37.1 C) (Oral)   Resp 16   Wt 255 lb 4 oz (115.8 kg)   SpO2 97%   BMI 36.62 kg/m   BP/Weight 02/01/2017 07/25/2016 11/04/2583  Systolic BP 277 824 235  Diastolic BP 70 88 68  Wt. (Lbs) 255.25 241.2 248.6  BMI 36.62 34.61 35.67    Physical Exam  Constitutional: He is oriented to person, place, and time. He appears well-developed. No distress.  Cardiovascular: Normal rate and regular rhythm.     Pulmonary/Chest: Effort normal and breath sounds normal. He has no wheezes. He has no rales.  Musculoskeletal:  Left parascapular region with tenderness to palpation.  Neurological: He is alert and oriented to person, place, and time.  Psychiatric: He has a normal mood and affect.  Vitals reviewed.   Lab Results  Component Value Date   WBC 5.7 01/04/2016   HGB 15.0 01/04/2016   HCT 44.4 01/04/2016   PLT 218.0 01/04/2016   GLUCOSE 143 (H) 04/08/2016   CHOL 104 04/08/2016   TRIG 114.0 04/08/2016   HDL 35.50 (L) 04/08/2016   LDLCALC 46 04/08/2016   ALT 20 04/08/2016   AST 12 04/08/2016   NA 141 04/08/2016   K 4.5 04/08/2016   CL 105 04/08/2016   CREATININE 1.23 04/08/2016   BUN 15 04/08/2016   CO2 29 04/08/2016   PSA 0.27 01/04/2016   HGBA1C 6.8 (H) 04/08/2016   MICROALBUR <0.7 04/08/2016    Assessment & Plan:   Problem List Items Addressed This Visit    Hyperlipidemia    Stable. Continue Lipitor. Lipid panel today.      Relevant Orders   Lipid panel   Thoracic back pain    Established problem, worsening. Treating with tramadol and Flexeril. Appears to be musculoskeletal in origin.  Relevant Medications   traMADol (ULTRAM) 50 MG tablet   cyclobenzaprine (FLEXERIL) 10 MG tablet   Type 2 diabetes mellitus without complication, without long-term current use of insulin ([facility]) - Primary    A1c today. Continue Jardiance and metformin.      Relevant Orders   Hemoglobin A1c   Comprehensive metabolic panel   Microalbumin / creatinine urine ratio    Other Visit Diagnoses    Screening for deficiency anemia       Relevant Orders   CBC      Meds ordered this encounter  Medications  . traMADol (ULTRAM) 50 MG tablet    Sig: Take 1 tablet (50 mg total) by mouth every 8 (eight) hours as needed.    Dispense:  30 tablet    Refill:  0  . cyclobenzaprine (FLEXERIL) 10 MG tablet    Sig: Take 1 tablet (10 mg total) by mouth at bedtime as needed for muscle spasms.     Dispense:  30 tablet    Refill:  0    Follow-up: Return for Diabetes follow up.  [place]

## 2017-02-01 NOTE — Assessment & Plan Note (Signed)
Established problem, worsening. Treating with tramadol and Flexeril. Appears to be musculoskeletal in origin.

## 2017-02-01 NOTE — Assessment & Plan Note (Signed)
Stable. Continue Lipitor. Lipid panel today.  

## 2017-02-01 NOTE — Assessment & Plan Note (Signed)
A1c today. Continue Jardiance and metformin.

## 2017-02-02 ENCOUNTER — Telehealth: Payer: Self-pay | Admitting: Family Medicine

## 2017-02-02 NOTE — Telephone Encounter (Addendum)
Patient advised of lab results see labs for documentation 

## 2017-02-02 NOTE — Telephone Encounter (Signed)
Pt called back returning your call. Please advise, thank you!  Call pt @ [phone]

## 2017-02-03 ENCOUNTER — Other Ambulatory Visit: Payer: Self-pay | Admitting: Family Medicine

## 2017-02-03 ENCOUNTER — Ambulatory Visit: Payer: Federal, State, Local not specified - PPO | Admitting: Family Medicine

## 2017-02-03 MED ORDER — ATORVASTATIN CALCIUM 80 MG PO TABS: 80.0000 mg | ORAL_TABLET | Freq: Every day | ORAL | 3 refills | Status: DC

## 2017-02-03 MED ORDER — EMPAGLIFLOZIN 25 MG PO TABS: 25.0000 mg | ORAL_TABLET | Freq: Every day | ORAL | 3 refills | Status: DC

## 2017-02-03 MED ORDER — METFORMIN HCL 1000 MG PO TABS: 1000.0000 mg | ORAL_TABLET | Freq: Two times a day (BID) | ORAL | 1 refills | Status: DC

## 2017-02-08 ENCOUNTER — Telehealth: Payer: Self-pay

## 2017-02-08 NOTE — Telephone Encounter (Signed)
Prior authorization submitted for Jardiance 25 mg .

## 2017-02-09 ENCOUNTER — Telehealth: Payer: Self-pay

## 2017-02-09 NOTE — Telephone Encounter (Signed)
error 

## 2017-03-06 LAB — HM DIABETES EYE EXAM

## 2017-08-04 ENCOUNTER — Encounter: Payer: Self-pay | Admitting: Family Medicine

## 2017-08-04 ENCOUNTER — Other Ambulatory Visit: Payer: Self-pay

## 2017-08-04 ENCOUNTER — Ambulatory Visit: Payer: Federal, State, Local not specified - PPO | Admitting: Family Medicine

## 2017-08-04 VITALS — BP 124/80 | HR 66 | Temp 98.4°F | Ht 70.0 in | Wt 255.2 lb

## 2017-08-04 DIAGNOSIS — E785 Hyperlipidemia, unspecified: Secondary | ICD-10-CM

## 2017-08-04 DIAGNOSIS — B351 Tinea unguium: Secondary | ICD-10-CM | POA: Diagnosis not present

## 2017-08-04 DIAGNOSIS — E119 Type 2 diabetes mellitus without complications: Secondary | ICD-10-CM | POA: Diagnosis not present

## 2017-08-04 DIAGNOSIS — M1A00X0 Idiopathic chronic gout, unspecified site, without tophus (tophi): Secondary | ICD-10-CM

## 2017-08-04 DIAGNOSIS — E669 Obesity, unspecified: Secondary | ICD-10-CM | POA: Insufficient documentation

## 2017-08-04 DIAGNOSIS — Z6836 Body mass index (BMI) 36.0-36.9, adult: Secondary | ICD-10-CM | POA: Diagnosis not present

## 2017-08-04 DIAGNOSIS — Z23 Encounter for immunization: Secondary | ICD-10-CM

## 2017-08-04 LAB — COMPREHENSIVE METABOLIC PANEL
ALT: 66 U/L — AB (ref 0–53)
AST: 26 U/L (ref 0–37)
Albumin: 4.3 g/dL (ref 3.5–5.2)
Alkaline Phosphatase: 62 U/L (ref 39–117)
BILIRUBIN TOTAL: 1.3 mg/dL — AB (ref 0.2–1.2)
BUN: 13 mg/dL (ref 6–23)
CALCIUM: 9.3 mg/dL (ref 8.4–10.5)
CHLORIDE: 106 meq/L (ref 96–112)
CO2: 30 mEq/L (ref 19–32)
CREATININE: 1.3 mg/dL (ref 0.40–1.50)
GFR: 61.3 mL/min (ref >60.00)
Glucose, Bld: 177 mg/dL — ABNORMAL HIGH (ref 70–99)
Potassium: 4.3 mEq/L (ref 3.5–5.1)
Sodium: 141 mEq/L (ref 135–145)
Total Protein: 6.7 g/dL (ref 6.0–8.3)

## 2017-08-04 LAB — LDL CHOLESTEROL, DIRECT: Direct LDL: 62 mg/dL

## 2017-08-04 MED ORDER — ALLOPURINOL 300 MG PO TABS: 300.0000 mg | ORAL_TABLET | Freq: Every day | ORAL | 1 refills | Status: DC

## 2017-08-04 MED ORDER — EMPAGLIFLOZIN 25 MG PO TABS: 25.0000 mg | ORAL_TABLET | Freq: Every day | ORAL | 3 refills | Status: DC

## 2017-08-04 NOTE — Assessment & Plan Note (Signed)
Not consistently taking medication.  Check LDL.

## 2017-08-04 NOTE — Progress Notes (Signed)
  Tommi Rumps, MD Phone: [phone]  Jermaine Ford is a 52 y.o. male who presents today for follow-up.  DIABETES Disease Monitoring: Blood Sugar ranges-150 fasting Polyuria/phagia/dipsia- no      optho-UTD Medications: Compliance- taking jardiance, metformin once daily Hypoglycemic symptoms- no  Hyperlipidemia: Patient is supposed to be taking Lipitor though does not take this consistently.  No chest pain.  Gout: No flares since his allopurinol was increased.  He takes this daily.  Takes colchicine only with flares.  Notes he typically gets them in his left foot and knee.  Asymptomatic now.  Onychomycosis: Patient notes toenail thickening for some time now.  He notes he dropped something on his left great toe and there has been a bruise on the tibial aspect of his left great toenail that has been moving along with his nail.  He is interested in treatment for his toenails.  Patient notes he does not have time to exercise and works very long hours so his food options are limited.  Social History   Tobacco Use  Smoking Status Never Smoker  Smokeless Tobacco Never Used     ROS see history of present illness  Objective  Physical Exam Vitals:   08/04/17 1044  BP: 124/80  Pulse: 66  Temp: 98.4 F (36.9 C)  SpO2: 97%    BP Readings from Last 3 Encounters:  08/04/17 124/80  02/01/17 116/70  07/25/16 122/88   Wt Readings from Last 3 Encounters:  08/04/17 255 lb 3.2 oz (115.8 kg)  02/01/17 255 lb 4 oz (115.8 kg)  07/25/16 241 lb 3.2 oz (109.4 kg)    Physical Exam  Constitutional: No distress.  Cardiovascular: Normal rate, regular rhythm and normal heart sounds.  Pulmonary/Chest: Effort normal and breath sounds normal.  Musculoskeletal: He exhibits no edema.  Neurological: He is alert. Gait normal.  Skin: Skin is warm and dry. He is not diaphoretic.     Assessment/Plan: Please see individual problem list.  Type 2 diabetes mellitus without complication,  without long-term current use of insulin ([place]) Suspect uncontrolled.  We will check lab work.  Consider extended release metformin to make dosing easier for patient.  Primary gout Asymptomatic.  Continue current regimen.  Onychomycosis Toenail findings consistent with onychomycosis.  Discussed potential treatments.  Check hepatic function and likely start on Lamisil.  He will return in 1 month for hepatic function panel.  Hyperlipidemia Not consistently taking medication.  Check LDL.  Obesity Discussed diet and exercise.   Orders Placed This Encounter  Procedures  . Pneumococcal polysaccharide vaccine 23-valent greater than or equal to 2yo subcutaneous/IM  . Direct LDL  . HgB A1c  . Comp Met (CMET)  . Hepatic function panel    Standing Status:   Future    Standing Expiration Date:   08/04/2018    Meds ordered this encounter  Medications  . empagliflozin (JARDIANCE) 25 MG TABS tablet    Sig: Take 25 mg by mouth daily.    Dispense:  90 tablet    Refill:  3  . allopurinol (ZYLOPRIM) 300 MG tablet    Sig: Take 1 tablet (300 mg total) by mouth daily.    Dispense:  90 tablet    Refill:  1    FOR NEXT FILL. [place], MD [place]

## 2017-08-04 NOTE — Addendum Note (Signed)
Addended by: Arby Barrette on: 08/04/2017 12:38 PM   Modules accepted: Orders

## 2017-08-04 NOTE — Assessment & Plan Note (Addendum)
Toenail findings consistent with onychomycosis.  Discussed potential treatments.  Check hepatic function and likely start on Lamisil.  He will return in 1 month for hepatic function panel.

## 2017-08-04 NOTE — Patient Instructions (Signed)
Nice to see you. We will check lab work today and contact you with the results. Once we see what your liver function is we will send in Lamisil for you.

## 2017-08-04 NOTE — Assessment & Plan Note (Signed)
Asymptomatic.  Continue current regimen. 

## 2017-08-04 NOTE — Assessment & Plan Note (Signed)
Suspect uncontrolled.  We will check lab work.  Consider extended release metformin to make dosing easier for patient.

## 2017-08-04 NOTE — Assessment & Plan Note (Signed)
Discussed diet and exercise 

## 2017-08-05 LAB — HEMOGLOBIN A1C
EAG (MMOL/L): 10.4 (calc)
Hgb A1c MFr Bld: 8.2 % of total Hgb — ABNORMAL HIGH (ref <5.7)
MEAN PLASMA GLUCOSE: 189 (calc)

## 2017-08-08 ENCOUNTER — Telehealth: Payer: Self-pay

## 2017-08-08 DIAGNOSIS — R17 Unspecified jaundice: Secondary | ICD-10-CM

## 2017-08-08 DIAGNOSIS — R945 Abnormal results of liver function studies: Secondary | ICD-10-CM

## 2017-08-08 DIAGNOSIS — R7989 Other specified abnormal findings of blood chemistry: Secondary | ICD-10-CM

## 2017-08-08 NOTE — Telephone Encounter (Signed)
-----   Message from Leone Haven, MD sent at 08/06/2017  8:17 PM EST ----- Please let the patient know that his A1c is worse than prior at 0.2.  I would suggest we switch him to the extended release metformin to see if that is more tolerable with once daily dosing.  1 of his liver function tests and his bilirubin were slightly elevated.  I would like to recheck these in a couple of weeks.  We did discuss placing him on medication for his toenails though given that his liver function test is a little high would like to hold off on that at this point.  His other labs are acceptable.  Once you schedule him for his lab appointment please place an order for fractionated bilirubin and hepatic function panel for the diagnosis codes of elevated bilirubin and elevated LFTs.  Thanks.

## 2017-08-12 ENCOUNTER — Other Ambulatory Visit: Payer: Self-pay | Admitting: Family Medicine

## 2017-08-12 MED ORDER — METFORMIN HCL ER 500 MG PO TB24: 2000.0000 mg | ORAL_TABLET | Freq: Every day | ORAL | 2 refills | Status: DC

## 2017-08-25 ENCOUNTER — Other Ambulatory Visit: Payer: Federal, State, Local not specified - PPO

## 2017-09-01 ENCOUNTER — Other Ambulatory Visit: Payer: Federal, State, Local not specified - PPO

## 2017-09-14 DIAGNOSIS — K080 Exfoliation of teeth due to systemic causes: Secondary | ICD-10-CM | POA: Diagnosis not present

## 2017-09-15 ENCOUNTER — Other Ambulatory Visit (INDEPENDENT_AMBULATORY_CARE_PROVIDER_SITE_OTHER): Payer: Federal, State, Local not specified - PPO

## 2017-09-15 DIAGNOSIS — R945 Abnormal results of liver function studies: Secondary | ICD-10-CM

## 2017-09-15 DIAGNOSIS — R7989 Other specified abnormal findings of blood chemistry: Secondary | ICD-10-CM

## 2017-09-15 DIAGNOSIS — R17 Unspecified jaundice: Secondary | ICD-10-CM

## 2017-09-15 LAB — HEPATIC FUNCTION PANEL
ALK PHOS: 66 U/L (ref 39–117)
ALT: 40 U/L (ref 0–53)
AST: 16 U/L (ref 0–37)
Albumin: 4.3 g/dL (ref 3.5–5.2)
Bilirubin, Direct: 0.2 mg/dL (ref 0.0–0.3)
TOTAL PROTEIN: 6.9 g/dL (ref 6.0–8.3)
Total Bilirubin: 1 mg/dL (ref 0.2–1.2)

## 2017-09-15 LAB — BILIRUBIN, FRACTIONATED(TOT/DIR/INDIR)
BILIRUBIN INDIRECT: 0.8 mg/dL (ref 0.2–1.2)
BILIRUBIN TOTAL: 1 mg/dL (ref 0.2–1.2)
Bilirubin, Direct: 0.2 mg/dL (ref 0.0–0.2)

## 2017-11-03 ENCOUNTER — Other Ambulatory Visit: Payer: Self-pay

## 2017-11-03 ENCOUNTER — Encounter: Payer: Self-pay | Admitting: Family Medicine

## 2017-11-03 ENCOUNTER — Ambulatory Visit: Payer: Federal, State, Local not specified - PPO | Admitting: Family Medicine

## 2017-11-03 VITALS — BP 128/70 | HR 72 | Temp 98.6°F | Ht 70.0 in | Wt 255.6 lb

## 2017-11-03 DIAGNOSIS — E785 Hyperlipidemia, unspecified: Secondary | ICD-10-CM

## 2017-11-03 DIAGNOSIS — K429 Umbilical hernia without obstruction or gangrene: Secondary | ICD-10-CM

## 2017-11-03 DIAGNOSIS — E119 Type 2 diabetes mellitus without complications: Secondary | ICD-10-CM | POA: Diagnosis not present

## 2017-11-03 LAB — POCT GLYCOSYLATED HEMOGLOBIN (HGB A1C): Hemoglobin A1C: 7.9

## 2017-11-03 MED ORDER — METFORMIN HCL ER 500 MG PO TB24: 2000.0000 mg | ORAL_TABLET | Freq: Every day | ORAL | 2 refills | Status: DC

## 2017-11-03 NOTE — Assessment & Plan Note (Addendum)
Refer for surgical intervention.  Hernia return precautions given.

## 2017-11-03 NOTE — Progress Notes (Signed)
  Tommi Rumps, MD Phone: [phone]  Jermaine Ford is a 52 y.o. male who presents today for f/u.  DIABETES Disease Monitoring: Blood Sugar ranges-134 this am Polyuria/phagia/dipsia- occ uria and dipsia      Optho- UTD Medications: Compliance- taking metformin immediate release 1000 mg BID, gets diarrhea when he does not eat after taking the metformin, notes he typically does not get the metformin twice a day given the side effects.  Also taking Jardiance. Hypoglycemic symptoms- no  Hyperlipidemia: He stopped his Lipitor.  No chest pain or claudication.  He has been trying to walk more.  He is watching what he eats area  Umbilical hernia: He notes no pain.  He does not like how it looks.  He is able to get it to go back and when it does pop out.  He has a bowel movement daily.  He is interested in repair.  Social History   Tobacco Use  Smoking Status Never Smoker  Smokeless Tobacco Never Used     ROS see history of present illness  Objective  Physical Exam Vitals:   11/03/17 1011  BP: 128/70  Pulse: 72  Temp: 98.6 F (37 C)  SpO2: 96%    BP Readings from Last 3 Encounters:  11/03/17 128/70  08/04/17 124/80  02/01/17 116/70   Wt Readings from Last 3 Encounters:  11/03/17 255 lb 9.6 oz (115.9 kg)  08/04/17 255 lb 3.2 oz (115.8 kg)  02/01/17 255 lb 4 oz (115.8 kg)    Physical Exam  Constitutional: No distress.  Cardiovascular: Normal rate, regular rhythm and normal heart sounds.  Pulmonary/Chest: Effort normal and breath sounds normal.  Abdominal: Soft. Bowel sounds are normal. He exhibits no distension. There is no tenderness.  Umbilical hernia noted that is fully reducible  Musculoskeletal: He exhibits no edema.  Neurological: He is alert.  Skin: Skin is warm and dry. He is not diaphoretic.     Assessment/Plan: Please see individual problem list.  Type 2 diabetes mellitus without complication, without long-term current use of insulin (HCC) Check  A1c.  Switch to extended release metformin to see if he can tolerate this better.  Continue Jardiance.  Continue to work on diet and exercise.  Umbilical hernia Refer for surgical intervention.  Hernia return precautions given.  Hyperlipidemia His cholesterol was very well controlled off of medication on last check.  We will plan to recheck at his next visit.  He will remain off of medication at this time.   Orders Placed This Encounter  Procedures  . Ambulatory referral to General Surgery    Referral Priority:   Routine    Referral Type:   Surgical    Referral Reason:   Specialty Services Required    Requested Specialty:   General Surgery    Number of Visits Requested:   1  . POCT HgB A1C    Meds ordered this encounter  Medications  . metFORMIN (GLUCOPHAGE XR) 500 MG 24 hr tablet    Sig: Take 4 tablets (2,000 mg total) by mouth daily with breakfast.    Dispense:  360 tablet    Refill:  2     Tommi Rumps, MD [place]

## 2017-11-03 NOTE — Patient Instructions (Signed)
Nice to see you. Please try to stay active and monitor your diet. We will refer you to general surgery for your hernia.  If your hernia pops out and stays out or you develop pain or lack of bowel movements need to be evaluated immediately.

## 2017-11-03 NOTE — Assessment & Plan Note (Addendum)
Check A1c.  Switch to extended release metformin to see if he can tolerate this better.  Continue Jardiance.  Continue to work on diet and exercise.

## 2017-11-03 NOTE — Assessment & Plan Note (Addendum)
His cholesterol was very well controlled off of medication on last check.  We will plan to recheck at his next visit.  He will remain off of medication at this time.

## 2017-11-06 ENCOUNTER — Encounter: Payer: Self-pay | Admitting: *Deleted

## 2017-12-07 ENCOUNTER — Ambulatory Visit: Payer: Federal, State, Local not specified - PPO | Admitting: General Surgery

## 2017-12-07 ENCOUNTER — Encounter: Payer: Self-pay | Admitting: General Surgery

## 2017-12-07 VITALS — BP 134/76 | HR 82 | Resp 14 | Ht 70.0 in | Wt 257.0 lb

## 2017-12-07 DIAGNOSIS — K439 Ventral hernia without obstruction or gangrene: Secondary | ICD-10-CM | POA: Diagnosis not present

## 2017-12-07 NOTE — Patient Instructions (Addendum)
Umbilical Hernia, Adult A hernia is a bulge of tissue that pushes through an opening between muscles. An umbilical hernia happens in the abdomen, near the belly button (umbilicus). The hernia may contain tissues from the small intestine, large intestine, or fatty tissue covering the intestines (omentum). Umbilical hernias in adults tend to get worse over time, and they require surgical treatment. There are several types of umbilical hernias. You may have:  A hernia located just above or below the umbilicus (indirect hernia). This is the most common type of umbilical hernia in adults.  A hernia that forms through an opening formed by the umbilicus (direct hernia).  A hernia that comes and goes (reducible hernia). A reducible hernia may be visible only when you strain, lift something heavy, or cough. This type of hernia can be pushed back into the abdomen (reduced).  A hernia that traps abdominal tissue inside the hernia (incarcerated hernia). This type of hernia cannot be reduced.  A hernia that cuts off blood flow to the tissues inside the hernia (strangulated hernia). The tissues can start to die if this happens. This type of hernia requires emergency treatment.  What are the causes? An umbilical hernia happens when tissue inside the abdomen presses on a weak area of the abdominal muscles. What increases the risk? You may have a greater risk of this condition if you:  Are obese.  Have had several pregnancies.  Have a buildup of fluid inside your abdomen (ascites).  Have had surgery that weakens the abdominal muscles.  What are the signs or symptoms? The main symptom of this condition is a painless bulge at or near the belly button. A reducible hernia may be visible only when you strain, lift something heavy, or cough. Other symptoms may include:  Dull pain.  A feeling of pressure.  Symptoms of a strangulated hernia may include:  Pain that gets increasingly worse.  Nausea and  vomiting.  Pain when pressing on the hernia.  Skin over the hernia becoming red or purple.  Constipation.  Blood in the stool.  How is this diagnosed? This condition may be diagnosed based on:  A physical exam. You may be asked to cough or strain while standing. These actions increase the pressure inside your abdomen and force the hernia through the opening in your muscles. Your health care provider may try to reduce the hernia by pressing on it.  Your symptoms and medical history.  How is this treated? Surgery is the only treatment for an umbilical hernia. Surgery for a strangulated hernia is done as soon as possible. If you have a small hernia that is not incarcerated, you may need to lose weight before having surgery. Follow these instructions at home:  Lose weight, if told by your health care provider.  Do not try to push the hernia back in.  Watch your hernia for any changes in color or size. Tell your health care provider if any changes occur.  You may need to avoid activities that increase pressure on your hernia.  Do not lift anything that is heavier than 10 lb (4.5 kg) until your health care provider says that this is safe.  Take over-the-counter and prescription medicines only as told by your health care provider.  Keep all follow-up visits as told by your health care provider. This is important. Contact a health care provider if:  Your hernia gets larger.  Your hernia becomes painful. Get help right away if:  You develop sudden, severe pain near the   area of your hernia.  You have pain as well as nausea or vomiting.  You have pain and the skin over your hernia changes color.  You develop a fever. This information is not intended to replace advice given to you by your health care provider. Make sure you discuss any questions you have with your health care provider. Document Released: 11/13/2015 Document Revised: 02/14/2016 Document Reviewed:  11/13/2015 Elsevier Interactive Patient Education  [name].   The patient is scheduled for surgery at [facility] on 12/25/17. He will pre admit by phone on 12/15/17 due to being out of town all the following week. The patient is aware of date and instructions.

## 2017-12-07 NOTE — Progress Notes (Signed)
Patient ID: Jermaine Ford, male   DOB: 1966-02-07, 52 y.o.   MRN: [id number]  Chief Complaint  Patient presents with  . Other    HPI Jermaine Ford is a 52 y.o. male here today for a evaluation of a umbilical hernia. Patient noticed this about 15 years ago. He states in the last two years the area has got bigger. . Pain with activity. Moves his bowels daily.  Wife michele is present at visit.  HPI  Past Medical History:  Diagnosis Date  . Arthritis   . Chicken pox   . Diabetes mellitus without complication ([place])   . Gout   . Herniated cervical disc    C-5/C-6    C-6/C-7  . Hyperlipidemia   . Hyperlipidemia 01/11/2016    Past Surgical History:  Procedure Laterality Date  . BACK SURGERY  2012   herniated disk replaced./C5-C7  . CHOLECYSTECTOMY  2002  . COLONOSCOPY    . UPPER GASTROINTESTINAL ENDOSCOPY      Family History  Problem Relation Age of Onset  . Breast cancer Mother   . Diabetes Mother   . Hypertension Mother   . Arthritis Maternal Grandmother   . Hypertension Maternal Grandmother   . Diabetes Maternal Grandmother   . Lung cancer Paternal Grandmother   . Prostate cancer Paternal Uncle   . Colon cancer Neg Hx   . Esophageal cancer Neg Hx   . Pancreatic cancer Neg Hx   . Rectal cancer Neg Hx   . Stomach cancer Neg Hx     Social History Social History   Tobacco Use  . Smoking status: Never Smoker  . Smokeless tobacco: Never Used  Substance Use Topics  . Alcohol use: Yes    Alcohol/week: 0.0 - 0.6 oz    Comment: rarely  . Drug use: No    No Known Allergies  Current Outpatient Medications  Medication Sig Dispense Refill  . allopurinol (ZYLOPRIM) 300 MG tablet Take 1 tablet (300 mg total) by mouth daily. 90 tablet 1  . aspirin 81 MG tablet Take 1 tablet (81 mg total) by mouth daily. 90 tablet 3  . atorvastatin (LIPITOR) 80 MG tablet Take 1 tablet (80 mg total) by mouth daily. 90 tablet 3  . empagliflozin (JARDIANCE) 25 MG TABS tablet Take 25  mg by mouth daily. 90 tablet 3  . metFORMIN (GLUCOPHAGE XR) 500 MG 24 hr tablet Take 4 tablets (2,000 mg total) by mouth daily with breakfast. 360 tablet 2  . traMADol (ULTRAM) 50 MG tablet Take 1 tablet (50 mg total) by mouth every 8 (eight) hours as needed. 30 tablet 0   Current Facility-Administered Medications  Medication Dose Route Frequency Provider Last Rate Last Dose  . 0.9 %  sodium chloride infusion  500 mL Intravenous Continuous Pyrtle, Lajuan Lines, MD        Review of Systems Review of Systems  Constitutional: Negative.   Respiratory: Negative.   Cardiovascular: Negative.     Blood pressure 134/76, pulse 82, resp. rate 14, height 5\' 10"  (1.778 m), weight 257 lb (116.6 kg).  Physical Exam Physical Exam  Constitutional: He is oriented to person, place, and time. He appears well-developed and well-nourished.  Cardiovascular: Normal rate, regular rhythm and normal heart sounds.  Pulmonary/Chest: Effort normal and breath sounds normal.  Abdominal: Soft. Normal appearance. A hernia ( ventral) is present.    Neurological: He is alert and oriented to person, place, and time.  Skin: Skin is warm and dry.  Data Reviewed  Hemoglobin A1c Nov 03, 2017: 7.9.  Hepatic function panel dated September 15, 2017 was normal.  Follow-up to previously elevated total bilirubin 6 weeks earlier.  Comprehensive metabolic panel dated August 04, 2017 showed a total bilirubin of 1.3.  Normal elevation of the ALT.  Creatinine 1.3 with an estimated GFR 61.  February 01, 2018 CBC showed a hemoglobin of 15.1 with an MCV of 90.8, white blood cell count 5600, platelet count 237,000.  Assessment    Symptomatic ventral hernia, partially reducible.    Plan  The fascial defect is likely large enough to require mesh reinforcement.  If it is smaller than 2 cm, this will be deferred.  Risks associated with mesh placement were discussed.  Hernia precautions and incarceration were discussed with the patient.  If they develop symptoms of an incarcerated hernia, they were encouraged to seek prompt medical attention.  I have recommended repair of the hernia using mesh on an outpatient basis in the near future. The risk of infection was reviewed. The role of prosthetic mesh to minimize the risk of recurrence was reviewed. HPI, Physical Exam, Assessment and Plan have been scribed under the direction and in the presence of Hervey Ard, MD.  Gaspar Cola, CMA  I have completed the exam and reviewed the above documentation for accuracy and completeness.  I agree with the above.  Haematologist has been used and any errors in dictation or transcription are unintentional.  Hervey Ard, M.D., F.A.C.S. The patient is scheduled for surgery at [facility] on 12/25/17. He will pre admit by phone on 12/15/17 due to being out of town all the following week. The patient is aware of date and instructions.  Documented by Caryl-Lyn Otis Brace LPN  Forest Gleason [place] Near 12/09/2017, 10:50 AM

## 2017-12-15 ENCOUNTER — Encounter
Admission: RE | Admit: 2017-12-15 | Discharge: 2017-12-15 | Disposition: A | Discharge status: Home / Self Care | Payer: Federal, State, Local not specified - PPO | Source: Ambulatory Visit | Attending: General Surgery | Admitting: General Surgery

## 2017-12-15 ENCOUNTER — Other Ambulatory Visit: Payer: Self-pay

## 2017-12-15 NOTE — Patient Instructions (Signed)
Your procedure is scheduled on: 12-25-17 MONDAY Report to Same Day Surgery 2nd floor medical mall [facility] Entrance-take elevator on left to 2nd floor.  Check in with surgery information desk.) To find out your arrival time please call [phone] between 1PM - 3PM on 12-22-17 FRIDAY  Remember: Instructions that are not followed completely may result in serious medical risk, up to and including death, or upon the discretion of your surgeon and anesthesiologist your surgery may need to be rescheduled.    _x___ 1. Do not eat food after midnight the night before your procedure. NO GUM OR CANDY AFTER MIDNIGHT.  You may drink clear liquids up to 2 hours before you are scheduled to arrive at the hospital for your procedure.  Do not drink clear liquids within 2 hours of your scheduled arrival to the hospital.  Clear liquids include  --Water or Apple juice without pulp  --Clear carbohydrate beverage such as ClearFast or Gatorade  --Black Coffee or Clear Tea (No milk, no creamers, do not add anything to the coffee or Tea     __x__ 2. No Alcohol for 24 hours before or after surgery.   __x__3. No Smoking or e-cigarettes for 24 prior to surgery.  Do not use any chewable tobacco products for at least 6 hour prior to surgery   ____  4. Bring all medications with you on the day of surgery if instructed.    __x__ 5. Notify your doctor if there is any change in your medical condition     (cold, fever, infections).    x___6. On the morning of surgery brush your teeth with toothpaste and water.  You may rinse your mouth with mouth wash if you wish.  Do not swallow any toothpaste or mouthwash.   Do not wear jewelry, make-up, hairpins, clips or nail polish.  Do not wear lotions, powders, or perfumes. You may wear deodorant.  Do not shave 48 hours prior to surgery. Men may shave face and neck.  Do not bring valuables to the hospital.    [facility] is not responsible for any belongings or  valuables.               Contacts, dentures or bridgework may not be worn into surgery.  Leave your suitcase in the car. After surgery it may be brought to your room.  For patients admitted to the hospital, discharge time is determined by your treatment team.  _  Patients discharged the day of surgery will not be allowed to drive home.  You will need someone to drive you home and stay with you the night of your procedure.    Please read over the following fact sheets that you were given:   [facility] Preparing for Surgery   _X___ TAKE THE FOLLOWING MEDICATION THE MORNING OF SURGERY WITH A SMALL SIP OF WATER. These include:  1. ALLPURINOL  2.  3.  4.  5.  6.  ____Fleets enema or Magnesium Citrate as directed.   _x___ Use CHG Soap or sage wipes as directed on instruction sheet   ____ Use inhalers on the day of surgery and bring to hospital day of surgery  _X___ Stop Metformin and Janumet 2 days prior to surgery-LAST DOSE OF METFORMIN Friday 12-22-17    ____ Take 1/2 of usual insulin dose the night before surgery and none on the morning surgery.   ____ Follow recommendations from Cardiologist, Pulmonologist or PCP regarding stopping Aspirin, Coumadin, Plavix ,Eliquis, Effient, or Pradaxa,  and Pletal.  ____Stop Anti-inflammatories such as Advil, Aleve, Ibuprofen, Motrin, Naproxen, Naprosyn, Goodies powders or aspirin products. OK to take Tylenol    ____ Stop supplements until after surgery.     ____ Bring C-Pap to the hospital.

## 2017-12-18 ENCOUNTER — Encounter
Admission: RE | Admit: 2017-12-18 | Discharge: 2017-12-18 | Disposition: A | Discharge status: Home / Self Care | Payer: Federal, State, Local not specified - PPO | Source: Ambulatory Visit | Attending: General Surgery | Admitting: General Surgery

## 2017-12-18 DIAGNOSIS — Z79899 Other long term (current) drug therapy: Secondary | ICD-10-CM | POA: Diagnosis not present

## 2017-12-18 DIAGNOSIS — R001 Bradycardia, unspecified: Secondary | ICD-10-CM | POA: Diagnosis not present

## 2017-12-18 DIAGNOSIS — Z01812 Encounter for preprocedural laboratory examination: Secondary | ICD-10-CM | POA: Diagnosis not present

## 2017-12-18 DIAGNOSIS — Z01810 Encounter for preprocedural cardiovascular examination: Secondary | ICD-10-CM | POA: Diagnosis not present

## 2017-12-18 DIAGNOSIS — Z79891 Long term (current) use of opiate analgesic: Secondary | ICD-10-CM | POA: Insufficient documentation

## 2017-12-18 DIAGNOSIS — E119 Type 2 diabetes mellitus without complications: Secondary | ICD-10-CM | POA: Diagnosis not present

## 2017-12-18 DIAGNOSIS — K439 Ventral hernia without obstruction or gangrene: Secondary | ICD-10-CM | POA: Diagnosis not present

## 2017-12-18 DIAGNOSIS — E785 Hyperlipidemia, unspecified: Secondary | ICD-10-CM | POA: Diagnosis not present

## 2017-12-18 LAB — BASIC METABOLIC PANEL
Anion gap: 9 (ref 5–15)
BUN: 18 mg/dL (ref 6–20)
CO2: 24 mmol/L (ref 22–32)
CREATININE: 0.99 mg/dL (ref 0.61–1.24)
Calcium: 8.6 mg/dL — ABNORMAL LOW (ref 8.9–10.3)
Chloride: 106 mmol/L (ref 101–111)
Glucose, Bld: 162 mg/dL — ABNORMAL HIGH (ref 65–99)
POTASSIUM: 4.1 mmol/L (ref 3.5–5.1)
SODIUM: 139 mmol/L (ref 135–145)

## 2017-12-20 ENCOUNTER — Encounter: Payer: Self-pay | Admitting: *Deleted

## 2017-12-25 ENCOUNTER — Ambulatory Visit: Payer: Federal, State, Local not specified - PPO | Admitting: Anesthesiology

## 2017-12-25 ENCOUNTER — Encounter
Admission: RE | Disposition: A | Discharge status: Home / Self Care | Payer: Self-pay | Source: Ambulatory Visit | Attending: General Surgery

## 2017-12-25 ENCOUNTER — Ambulatory Visit
Admission: RE | Admit: 2017-12-25 | Discharge: 2017-12-25 | Disposition: A | Discharge status: Home / Self Care | Payer: Federal, State, Local not specified - PPO | Source: Ambulatory Visit | Attending: General Surgery | Admitting: General Surgery

## 2017-12-25 ENCOUNTER — Other Ambulatory Visit: Payer: Self-pay

## 2017-12-25 DIAGNOSIS — Z8249 Family history of ischemic heart disease and other diseases of the circulatory system: Secondary | ICD-10-CM | POA: Diagnosis not present

## 2017-12-25 DIAGNOSIS — Z8042 Family history of malignant neoplasm of prostate: Secondary | ICD-10-CM | POA: Diagnosis not present

## 2017-12-25 DIAGNOSIS — Z803 Family history of malignant neoplasm of breast: Secondary | ICD-10-CM | POA: Insufficient documentation

## 2017-12-25 DIAGNOSIS — K436 Other and unspecified ventral hernia with obstruction, without gangrene: Secondary | ICD-10-CM | POA: Insufficient documentation

## 2017-12-25 DIAGNOSIS — E785 Hyperlipidemia, unspecified: Secondary | ICD-10-CM | POA: Diagnosis not present

## 2017-12-25 DIAGNOSIS — I739 Peripheral vascular disease, unspecified: Secondary | ICD-10-CM | POA: Diagnosis not present

## 2017-12-25 DIAGNOSIS — Z833 Family history of diabetes mellitus: Secondary | ICD-10-CM | POA: Diagnosis not present

## 2017-12-25 DIAGNOSIS — M1990 Unspecified osteoarthritis, unspecified site: Secondary | ICD-10-CM | POA: Diagnosis not present

## 2017-12-25 DIAGNOSIS — Z801 Family history of malignant neoplasm of trachea, bronchus and lung: Secondary | ICD-10-CM | POA: Diagnosis not present

## 2017-12-25 DIAGNOSIS — K439 Ventral hernia without obstruction or gangrene: Secondary | ICD-10-CM | POA: Diagnosis not present

## 2017-12-25 DIAGNOSIS — E119 Type 2 diabetes mellitus without complications: Secondary | ICD-10-CM | POA: Diagnosis not present

## 2017-12-25 DIAGNOSIS — Z7982 Long term (current) use of aspirin: Secondary | ICD-10-CM | POA: Insufficient documentation

## 2017-12-25 DIAGNOSIS — K432 Incisional hernia without obstruction or gangrene: Secondary | ICD-10-CM | POA: Diagnosis not present

## 2017-12-25 DIAGNOSIS — Z9049 Acquired absence of other specified parts of digestive tract: Secondary | ICD-10-CM | POA: Insufficient documentation

## 2017-12-25 DIAGNOSIS — Z79899 Other long term (current) drug therapy: Secondary | ICD-10-CM | POA: Insufficient documentation

## 2017-12-25 HISTORY — PX: VENTRAL HERNIA REPAIR: SHX424

## 2017-12-25 LAB — GLUCOSE, CAPILLARY
GLUCOSE-CAPILLARY: 161 mg/dL — AB (ref 70–99)
GLUCOSE-CAPILLARY: 209 mg/dL — AB (ref 70–99)

## 2017-12-25 SURGERY — REPAIR, HERNIA, VENTRAL
Anesthesia: General | Wound class: Clean

## 2017-12-25 MED ORDER — SUCCINYLCHOLINE CHLORIDE 20 MG/ML IJ SOLN
INTRAMUSCULAR | Status: AC
  Filled 2017-12-25: qty 1

## 2017-12-25 MED ORDER — GABAPENTIN 300 MG PO CAPS
300.0000 mg | ORAL_CAPSULE | ORAL | Status: AC
  Administered 2017-12-25: 300 mg via ORAL

## 2017-12-25 MED ORDER — GABAPENTIN 300 MG PO CAPS
ORAL_CAPSULE | ORAL | Status: AC
  Administered 2017-12-25: 300 mg via ORAL
  Filled 2017-12-25: qty 1

## 2017-12-25 MED ORDER — HYDROCODONE-ACETAMINOPHEN 5-325 MG PO TABS: 1.0000 | ORAL_TABLET | ORAL | 0 refills | Status: DC | PRN

## 2017-12-25 MED ORDER — SUCCINYLCHOLINE CHLORIDE 20 MG/ML IJ SOLN
INTRAMUSCULAR | Status: DC | PRN
  Administered 2017-12-25: 100 mg via INTRAVENOUS

## 2017-12-25 MED ORDER — ACETAMINOPHEN 10 MG/ML IV SOLN
INTRAVENOUS | Status: DC | PRN
  Administered 2017-12-25: 1000 mg via INTRAVENOUS

## 2017-12-25 MED ORDER — CELECOXIB 200 MG PO CAPS
ORAL_CAPSULE | ORAL | Status: AC
  Filled 2017-12-25: qty 1

## 2017-12-25 MED ORDER — DEXAMETHASONE SODIUM PHOSPHATE 10 MG/ML IJ SOLN
INTRAMUSCULAR | Status: DC | PRN
  Administered 2017-12-25: 10 mg via INTRAVENOUS

## 2017-12-25 MED ORDER — HYDROMORPHONE HCL 1 MG/ML IJ SOLN
INTRAMUSCULAR | Status: DC | PRN
  Administered 2017-12-25 (×2): .2 mg via INTRAVENOUS

## 2017-12-25 MED ORDER — FENTANYL CITRATE (PF) 100 MCG/2ML IJ SOLN
INTRAMUSCULAR | Status: AC
  Filled 2017-12-25: qty 2

## 2017-12-25 MED ORDER — SUGAMMADEX SODIUM 200 MG/2ML IV SOLN
INTRAVENOUS | Status: AC
  Filled 2017-12-25: qty 2

## 2017-12-25 MED ORDER — FAMOTIDINE 20 MG PO TABS
ORAL_TABLET | ORAL | Status: AC
  Administered 2017-12-25: 20 mg via ORAL
  Filled 2017-12-25: qty 1

## 2017-12-25 MED ORDER — LIDOCAINE HCL (CARDIAC) PF 100 MG/5ML IV SOSY
PREFILLED_SYRINGE | INTRAVENOUS | Status: DC | PRN
  Administered 2017-12-25: 100 mg via INTRAVENOUS

## 2017-12-25 MED ORDER — BUPIVACAINE HCL (PF) 0.5 % IJ SOLN
INTRAMUSCULAR | Status: DC | PRN
  Administered 2017-12-25: 30 mL

## 2017-12-25 MED ORDER — ACETAMINOPHEN 10 MG/ML IV SOLN
INTRAVENOUS | Status: AC
  Filled 2017-12-25: qty 100

## 2017-12-25 MED ORDER — BUPIVACAINE-EPINEPHRINE (PF) 0.5% -1:200000 IJ SOLN
INTRAMUSCULAR | Status: AC
  Filled 2017-12-25: qty 30

## 2017-12-25 MED ORDER — CELECOXIB 200 MG PO CAPS
200.0000 mg | ORAL_CAPSULE | ORAL | Status: AC
  Administered 2017-12-25: 200 mg via ORAL

## 2017-12-25 MED ORDER — MIDAZOLAM HCL 2 MG/2ML IJ SOLN
INTRAMUSCULAR | Status: DC | PRN
  Administered 2017-12-25: 2 mg via INTRAVENOUS

## 2017-12-25 MED ORDER — FENTANYL CITRATE (PF) 100 MCG/2ML IJ SOLN: 25.0000 ug | INTRAMUSCULAR | Status: DC | PRN

## 2017-12-25 MED ORDER — ROCURONIUM BROMIDE 100 MG/10ML IV SOLN
INTRAVENOUS | Status: DC | PRN
  Administered 2017-12-25 (×2): 10 mg via INTRAVENOUS

## 2017-12-25 MED ORDER — CEFAZOLIN SODIUM-DEXTROSE 2-4 GM/100ML-% IV SOLN
2.0000 g | INTRAVENOUS | Status: AC
  Administered 2017-12-25: 2 g via INTRAVENOUS

## 2017-12-25 MED ORDER — CEFAZOLIN SODIUM-DEXTROSE 2-4 GM/100ML-% IV SOLN
INTRAVENOUS | Status: AC
  Filled 2017-12-25: qty 100

## 2017-12-25 MED ORDER — ROCURONIUM BROMIDE 50 MG/5ML IV SOLN
INTRAVENOUS | Status: AC
  Filled 2017-12-25: qty 1

## 2017-12-25 MED ORDER — PROPOFOL 10 MG/ML IV BOLUS
INTRAVENOUS | Status: DC | PRN
  Administered 2017-12-25: 150 mg via INTRAVENOUS
  Administered 2017-12-25: 50 mg via INTRAVENOUS

## 2017-12-25 MED ORDER — ONDANSETRON HCL 4 MG/2ML IJ SOLN: 4.0000 mg | Freq: Once | INTRAMUSCULAR | Status: DC | PRN

## 2017-12-25 MED ORDER — SUGAMMADEX SODIUM 500 MG/5ML IV SOLN
INTRAVENOUS | Status: DC | PRN
  Administered 2017-12-25: 240 mg via INTRAVENOUS

## 2017-12-25 MED ORDER — FAMOTIDINE 20 MG PO TABS
20.0000 mg | ORAL_TABLET | Freq: Once | ORAL | Status: AC
  Administered 2017-12-25: 20 mg via ORAL

## 2017-12-25 MED ORDER — ONDANSETRON HCL 4 MG/2ML IJ SOLN
INTRAMUSCULAR | Status: DC | PRN
  Administered 2017-12-25: 4 mg via INTRAVENOUS

## 2017-12-25 MED ORDER — LACTATED RINGERS IV SOLN
INTRAVENOUS | Status: DC | PRN
  Administered 2017-12-25 (×2): via INTRAVENOUS

## 2017-12-25 MED ORDER — FENTANYL CITRATE (PF) 100 MCG/2ML IJ SOLN
INTRAMUSCULAR | Status: DC | PRN
  Administered 2017-12-25 (×2): 50 ug via INTRAVENOUS

## 2017-12-25 MED ORDER — HYDROMORPHONE HCL 1 MG/ML IJ SOLN
INTRAMUSCULAR | Status: AC
  Filled 2017-12-25: qty 1

## 2017-12-25 MED ORDER — MIDAZOLAM HCL 2 MG/2ML IJ SOLN
INTRAMUSCULAR | Status: AC
  Filled 2017-12-25: qty 2

## 2017-12-25 MED ORDER — SODIUM CHLORIDE 0.9 % IV SOLN
INTRAVENOUS | Status: DC
  Administered 2017-12-25: 14:00:00 via INTRAVENOUS

## 2017-12-25 MED ORDER — BUPIVACAINE HCL (PF) 0.5 % IJ SOLN
INTRAMUSCULAR | Status: AC
  Filled 2017-12-25: qty 30

## 2017-12-25 SURGICAL SUPPLY — 37 items
BLADE SURG 15 STRL SS SAFETY (BLADE) ×2 IMPLANT
CANISTER SUCT 1200ML W/VALVE (MISCELLANEOUS) ×2 IMPLANT
CHLORAPREP W/TINT 26ML (MISCELLANEOUS) ×2 IMPLANT
DRAIN CHANNEL JP 15F RND 16 (MISCELLANEOUS) IMPLANT
DRAPE CHEST BREAST 77X106 FENE (MISCELLANEOUS) IMPLANT
DRAPE LAPAROTOMY 100X77 ABD (DRAPES) ×2 IMPLANT
DRSG OPSITE POSTOP 4X6 (GAUZE/BANDAGES/DRESSINGS) ×2 IMPLANT
DRSG TEGADERM 4X4.75 (GAUZE/BANDAGES/DRESSINGS) ×4 IMPLANT
DRSG TELFA 3X8 NADH (GAUZE/BANDAGES/DRESSINGS) ×2 IMPLANT
ELECT REM PT RETURN 9FT ADLT (ELECTROSURGICAL) ×2
ELECTRODE REM PT RTRN 9FT ADLT (ELECTROSURGICAL) ×1 IMPLANT
GAUZE SPONGE 4X4 12PLY STRL (GAUZE/BANDAGES/DRESSINGS) ×2 IMPLANT
GLOVE BIO SURGEON STRL SZ7.5 (GLOVE) ×2 IMPLANT
GLOVE INDICATOR 8.0 STRL GRN (GLOVE) ×2 IMPLANT
GOWN STRL REUS W/ TWL LRG LVL3 (GOWN DISPOSABLE) ×2 IMPLANT
GOWN STRL REUS W/TWL LRG LVL3 (GOWN DISPOSABLE) ×2
KIT TURNOVER KIT A (KITS) ×2 IMPLANT
LABEL OR SOLS (LABEL) ×2 IMPLANT
MESH VENTRALEX ST 8CM LRG (Mesh General) ×2 IMPLANT
NEEDLE HYPO 22GX1.5 SAFETY (NEEDLE) ×2 IMPLANT
NEEDLE HYPO 25X1 1.5 SAFETY (NEEDLE) ×2 IMPLANT
NS IRRIG 500ML POUR BTL (IV SOLUTION) ×2 IMPLANT
PACK BASIN MINOR ARMC (MISCELLANEOUS) ×2 IMPLANT
SPONGE LAP 18X18 RF (DISPOSABLE) ×4 IMPLANT
SPONGE XRAY 4X4 16PLY STRL (MISCELLANEOUS) ×2 IMPLANT
STAPLER SKIN PROX 35W (STAPLE) IMPLANT
STRIP CLOSURE SKIN 1/2X4 (GAUZE/BANDAGES/DRESSINGS) ×2 IMPLANT
SUT SURGILON 0 BLK (SUTURE) ×4 IMPLANT
SUT VIC AB 2-0 BRD 54 (SUTURE) ×6 IMPLANT
SUT VIC AB 2-0 CT1 (SUTURE) ×4 IMPLANT
SUT VIC AB 3-0 SH 27 (SUTURE) ×1
SUT VIC AB 3-0 SH 27X BRD (SUTURE) ×1 IMPLANT
SUT VIC AB 4-0 FS2 27 (SUTURE) ×2 IMPLANT
SUT VICRYL+ 3-0 144IN (SUTURE) ×2 IMPLANT
SYR 10ML LL (SYRINGE) ×2 IMPLANT
SYR 3ML LL SCALE MARK (SYRINGE) ×2 IMPLANT
TRAY FOLEY MTR SLVR 16FR STAT (SET/KITS/TRAYS/PACK) IMPLANT

## 2017-12-25 NOTE — Discharge Instructions (Signed)

## 2017-12-25 NOTE — Anesthesia Post-op Follow-up Note (Deleted)
Anesthesia QCDR form completed.        

## 2017-12-25 NOTE — Op Note (Signed)
Preoperative diagnosis: Ventral hernia with incarcerated omentum.  Postoperative diagnosis: Same.  Operative procedure: Repair of incarcerated ventral hernia, umbilicus to me, ventral light mesh placement.  Operating Surgeon: Hervey Ard, MD.  Anesthesia: General endotracheal; Marcaine 0.5%, plain, 30 cc.  Estimated blood loss: 150 cc.  Clinical note: This 52 year old male had previously undergone a laparoscopic procedure and had developed a hernia at the umbilical port site.  This is steadily enlarged and is becoming symptomatic.  It is nonreducible.  Appears to be multilobulated.  He was admitted for elective repair.  The potential for mesh placement was discussed.  He received Kefzol prior to the procedure.  Operative note: The procedure was begun with the patient under LMA anesthesia.  He had an infraumbilical incision and this was extended to a semicircular incision from the 3 to 9 o'clock position after infiltration of local anesthesia for postoperative comfort.  The skin was incised sharply and remaining dissection completed with electrocautery.  The patient was found to have a multilobulated hernia sac with omentum firmly adherent to the internal aspect of the sac.  A tedious dissection to free the omentum from the sac was undertaken.  The majority of the bleeding occurred secondary to the omental veins.  It was necessary to extend the incision inferiorly to be able to bring the omentum out and to transect the portion that was chronically inflamed that had been adherent in the hernia sac.  This was done between Zearing clamps and hemostasis was with 2-0 Vicryl ties.  After this was completed the undersurface of the fascia was cleared circumferentially.  The defect itself was transversely orientated and approximately 1 x 3 cm in length.  A inferior extension had been made to provide better exposure.  The skin had been incised superiorly and it was elected to resect this triangle of umbilical  skin on both sides as it was likely to become ischemic.  An 8 cm ventral light mesh was placed and transfixion sutures placed x8, full-thickness oh Surgilon sutures through the fascia under direct vision.  The small vertical portion of the fascial defect inferiorly was closed with interrupted oh Surgilon sutures.  The remaining defect was closed transversely with interrupted oh Surgilon sutures.  The mesh was grasped with each suture placement to minimize dead space.  The adipose layer was then approximated in layers with interrupted 2-0 Vicryl figure-of-eight sutures.  An inverted T defect remained and this was closed with interrupted 4-0 Vicryl subarticular sutures.  Benzoin, Steri-Strips followed by Telfa and a honeycomb dressing was then applied.  The patient was extubated deep with a single cough without evidence of suture disruption.  He was taken to recovery room in stable condition.

## 2017-12-25 NOTE — Anesthesia Procedure Notes (Signed)
Procedure Name: LMA Insertion Date/Time: 12/25/2017 2:16 PM Performed by: Justus Memory, CRNA Pre-anesthesia Checklist: Patient identified, Patient being monitored, Timeout performed, Emergency Drugs available and Suction available Patient Re-evaluated:Patient Re-evaluated prior to induction Oxygen Delivery Method: Circle system utilized Preoxygenation: Pre-oxygenation with 100% oxygen Induction Type: IV induction Ventilation: Mask ventilation without difficulty LMA: LMA inserted LMA Size: 4.5 Tube type: Oral Number of attempts: 1 Placement Confirmation: positive ETCO2 and breath sounds checked- equal and bilateral Tube secured with: Tape Dental Injury: Teeth and Oropharynx as per pre-operative assessment

## 2017-12-25 NOTE — H&P (Signed)
No change in clinical history or exam. For ventral hernia repair. Mesh if larger than 3 +/- cm fascial defect.

## 2017-12-25 NOTE — Anesthesia Procedure Notes (Signed)
Procedure Name: Intubation Date/Time: 12/25/2017 2:56 PM Performed by: Dionne Bucy, CRNA Pre-anesthesia Checklist: Patient identified, Patient being monitored, Timeout performed, Emergency Drugs available and Suction available Patient Re-evaluated:Patient Re-evaluated prior to induction Oxygen Delivery Method: Circle system utilized Preoxygenation: Pre-oxygenation with 100% oxygen Induction Type: IV induction and Combination inhalational/ intravenous induction Ventilation: Mask ventilation without difficulty Laryngoscope Size: McGraph and 4 Grade View: Grade II Tube type: Oral Laser Tube: Cuffed inflated with minimal occlusive pressure - saline Tube size: 7.5 mm Number of attempts: 1 Airway Equipment and Method: Stylet and Video-laryngoscopy Placement Confirmation: ETT inserted through vocal cords under direct vision,  positive ETCO2 and breath sounds checked- equal and bilateral Secured at: 21 cm Tube secured with: Tape Dental Injury: Teeth and Oropharynx as per pre-operative assessment

## 2017-12-25 NOTE — Anesthesia Preprocedure Evaluation (Signed)
Anesthesia Evaluation  Patient identified by MRN, date of birth, ID band Patient awake    Reviewed: Allergy & Precautions, H&P , NPO status , Patient's Chart, lab work & pertinent test results, reviewed documented beta blocker date and time   Airway Mallampati: II  TM Distance: >3 FB Neck ROM: full    Dental  (+) Teeth Intact   Pulmonary neg pulmonary ROS,    Pulmonary exam normal        Cardiovascular Exercise Tolerance: Good + Peripheral Vascular Disease  negative cardio ROS Normal cardiovascular exam Rate:Normal     Neuro/Psych negative neurological ROS  negative psych ROS   GI/Hepatic negative GI ROS, Neg liver ROS,   Endo/Other  negative endocrine ROSdiabetes  Renal/GU negative Renal ROS  negative genitourinary   Musculoskeletal   Abdominal   Peds  Hematology negative hematology ROS (+)   Anesthesia Other Findings   Reproductive/Obstetrics negative OB ROS                             Anesthesia Physical Anesthesia Plan  ASA: II  Anesthesia Plan: General LMA   Post-op Pain Management:    Induction:   PONV Risk Score and Plan:   Airway Management Planned:   Additional Equipment:   Intra-op Plan:   Post-operative Plan:   Informed Consent: I have reviewed the patients History and Physical, chart, labs and discussed the procedure including the risks, benefits and alternatives for the proposed anesthesia with the patient or authorized representative who has indicated his/her understanding and acceptance.     Plan Discussed with: CRNA  Anesthesia Plan Comments:         Anesthesia Quick Evaluation

## 2017-12-25 NOTE — Transfer of Care (Signed)
Immediate Anesthesia Transfer of Care Note  Patient: Jermaine Ford  Procedure(s) Performed: HERNIA REPAIR VENTRAL ADULT (N/A )  Patient Location: PACU  Anesthesia Type:General  Level of Consciousness: sedated  Airway & Oxygen Therapy: Patient Spontanous Breathing and Patient connected to face mask oxygen  Post-op Assessment: Report given to RN and Post -op Vital signs reviewed and stable  Post vital signs: Reviewed and stable  Last Vitals:  Vitals Value Taken Time  BP 140/90 12/25/2017  4:13 PM  Temp 36.4 C 12/25/2017  4:13 PM  Pulse 80 12/25/2017  4:17 PM  Resp 15 12/25/2017  4:17 PM  SpO2 98 % 12/25/2017  4:17 PM  Vitals shown include unvalidated device data.  Last Pain:  Vitals:   12/25/17 1613  TempSrc:   PainSc: Asleep         Complications: No apparent anesthesia complications

## 2017-12-25 NOTE — Anesthesia Postprocedure Evaluation (Signed)
Anesthesia Post Note  Patient: Jermaine Ford  Procedure(s) Performed: HERNIA REPAIR VENTRAL ADULT (N/A )  Patient location during evaluation: PACU Anesthesia Type: General Level of consciousness: awake and alert Pain management: pain level controlled Vital Signs Assessment: post-procedure vital signs reviewed and stable Respiratory status: spontaneous breathing and respiratory function stable Cardiovascular status: stable Anesthetic complications: no     Last Vitals:  Vitals:   12/25/17 1713 12/25/17 1728  BP: 134/82 136/82  Pulse: 77 78  Resp: 12 16  Temp:  36.9 C  SpO2: 96% 95%    Last Pain:  Vitals:   12/25/17 1728  TempSrc:   PainSc: 1                  Doni Widmer K

## 2017-12-25 NOTE — Anesthesia Post-op Follow-up Note (Signed)
Anesthesia QCDR form completed.        

## 2017-12-26 ENCOUNTER — Encounter: Payer: Self-pay | Admitting: General Surgery

## 2018-01-02 ENCOUNTER — Encounter: Payer: Self-pay | Admitting: General Surgery

## 2018-01-02 ENCOUNTER — Ambulatory Visit (INDEPENDENT_AMBULATORY_CARE_PROVIDER_SITE_OTHER): Payer: Federal, State, Local not specified - PPO | Admitting: General Surgery

## 2018-01-02 VITALS — BP 120/68 | HR 80 | Resp 16 | Ht 70.0 in | Wt 244.0 lb

## 2018-01-02 DIAGNOSIS — K439 Ventral hernia without obstruction or gangrene: Secondary | ICD-10-CM

## 2018-01-02 NOTE — Progress Notes (Signed)
Patient ID: Jermaine Ford, male   DOB: 1966/02/01, 52 y.o.   MRN: [id number]  Chief Complaint  Patient presents with  . Routine Post Op    HPI Jermaine Ford is a 52 y.o. male here today for his post op ventral hernia repair done on 12-25-17. He states he is sore but doing well. Bowels moving regularly.  He is here with his wife, Jermaine Ford.  HPI  Past Medical History:  Diagnosis Date  . Arthritis   . Chicken pox   . Diabetes mellitus without complication (Jermaine Ford)   . Gout   . Herniated cervical disc    C-5/C-6    C-6/C-7  . Hyperlipidemia   . Hyperlipidemia 01/11/2016    Past Surgical History:  Procedure Laterality Date  . BACK SURGERY  2012   herniated disk replaced./C5-C7  . CHOLECYSTECTOMY  2002  . COLONOSCOPY    . UPPER GASTROINTESTINAL ENDOSCOPY    . VENTRAL HERNIA REPAIR N/A 12/25/2017   8 cm Ventral-lite mesh placed. Surgeon: Robert Bellow, MD;  Location: [facility] ORS;  Service: General;  Laterality: N/A;    Family History  Problem Relation Age of Onset  . Breast cancer Mother   . Diabetes Mother   . Hypertension Mother   . Arthritis Maternal Grandmother   . Hypertension Maternal Grandmother   . Diabetes Maternal Grandmother   . Lung cancer Paternal Grandmother   . Prostate cancer Paternal Uncle   . Colon cancer Neg Hx   . Esophageal cancer Neg Hx   . Pancreatic cancer Neg Hx   . Rectal cancer Neg Hx   . Stomach cancer Neg Hx     Social History Social History   Tobacco Use  . Smoking status: Never Smoker  . Smokeless tobacco: Never Used  Substance Use Topics  . Alcohol use: Yes    Alcohol/week: 0.0 - 0.6 oz    Comment: rarely  . Drug use: No    No Known Allergies  Current Outpatient Medications  Medication Sig Dispense Refill  . acetaminophen (TYLENOL) 500 MG tablet Take 500 mg by mouth daily as needed for moderate pain or headache.    . allopurinol (ZYLOPRIM) 300 MG tablet Take 1 tablet (300 mg total) by mouth daily. (Patient taking  differently: Take 300 mg by mouth every morning. ) 90 tablet 1  . aspirin 81 MG tablet Take 1 tablet (81 mg total) by mouth daily. 90 tablet 3  . atorvastatin (LIPITOR) 80 MG tablet Take 1 tablet (80 mg total) by mouth daily. 90 tablet 3  . empagliflozin (JARDIANCE) 25 MG TABS tablet Take 25 mg by mouth daily. 90 tablet 3  . metFORMIN (GLUCOPHAGE XR) 500 MG 24 hr tablet Take 4 tablets (2,000 mg total) by mouth daily with breakfast. 360 tablet 2   Current Facility-Administered Medications  Medication Dose Route Frequency Provider Last Rate Last Dose  . 0.9 %  sodium chloride infusion  500 mL Intravenous Continuous Pyrtle, Lajuan Lines, MD        Review of Systems Review of Systems  Constitutional: Negative.   Respiratory: Negative.   Cardiovascular: Negative.     Blood pressure 120/68, pulse 80, resp. rate 16, height 5\' 10"  (1.778 m), weight 244 lb (110.7 kg), SpO2 98 %.  Physical Exam Physical Exam  Constitutional: He is oriented to person, place, and time. He appears well-developed and well-nourished.  Abdominal: Soft.    Hernia repair intact, steri strips in place.  Neurological: He is alert and oriented  to person, place, and time.  Skin: Skin is warm and dry.  Psychiatric: His behavior is normal.    Data Reviewed Patient had a multi-compartment hernia with entrapped omentum.  About 1/2 pound of omentum was excised during the procedure.  EBL 150 cc.  Assessment    Doing well post ventral hernia repair.    Plan    The patient is aware to use a heating pad as needed for comfort. Follow up in 1 month     HPI, Physical Exam, Assessment and Plan have been scribed under the direction and in the presence of Robert Bellow, MD. Karie Fetch, RN  I have completed the exam and reviewed the above documentation for accuracy and completeness.  I agree with the above.  Haematologist has been used and any errors in dictation or transcription are unintentional.  Hervey Ard,  M.D., F.A.C.S.   [name] 01/02/2018, 8:51 PM

## 2018-01-02 NOTE — Patient Instructions (Addendum)
The patient is aware to call back for any questions or concerns. The patient is aware to use a heating pad as needed for comfort.  

## 2018-01-04 ENCOUNTER — Encounter: Payer: Self-pay | Admitting: General Surgery

## 2018-01-30 ENCOUNTER — Ambulatory Visit: Payer: Federal, State, Local not specified - PPO | Admitting: General Surgery

## 2018-02-01 ENCOUNTER — Encounter: Payer: Self-pay | Admitting: General Surgery

## 2018-02-01 ENCOUNTER — Ambulatory Visit (INDEPENDENT_AMBULATORY_CARE_PROVIDER_SITE_OTHER): Payer: Federal, State, Local not specified - PPO | Admitting: General Surgery

## 2018-02-01 VITALS — BP 136/74 | HR 72 | Resp 12 | Ht 70.0 in | Wt 234.0 lb

## 2018-02-01 DIAGNOSIS — K439 Ventral hernia without obstruction or gangrene: Secondary | ICD-10-CM

## 2018-02-01 NOTE — Progress Notes (Signed)
Patient ID: Jermaine Ford, male   DOB: January 28, 1966, 52 y.o.   MRN: [id number]  Chief Complaint  Patient presents with  . Follow-up    HPI Jermaine Ford is a 52 y.o. male here today for here his follow up ventral hernia repair done on 12/25/2017. Patient states he is doing well.   HPI  Past Medical History:  Diagnosis Date  . Arthritis   . Chicken pox   . Diabetes mellitus without complication ([place])   . Gout   . Herniated cervical disc    C-5/C-6    C-6/C-7  . Hyperlipidemia   . Hyperlipidemia 01/11/2016    Past Surgical History:  Procedure Laterality Date  . BACK SURGERY  2012   herniated disk replaced./C5-C7  . CHOLECYSTECTOMY  2002  . COLONOSCOPY    . UPPER GASTROINTESTINAL ENDOSCOPY    . VENTRAL HERNIA REPAIR N/A 12/25/2017   Procedure: HERNIA REPAIR VENTRAL ADULT;  Surgeon: Robert Bellow, MD;  Location: [facility] ORS;  Service: General;  Laterality: N/A;    Family History  Problem Relation Age of Onset  . Breast cancer Mother   . Diabetes Mother   . Hypertension Mother   . Arthritis Maternal Grandmother   . Hypertension Maternal Grandmother   . Diabetes Maternal Grandmother   . Lung cancer Paternal Grandmother   . Prostate cancer Paternal Uncle   . Colon cancer Neg Hx   . Esophageal cancer Neg Hx   . Pancreatic cancer Neg Hx   . Rectal cancer Neg Hx   . Stomach cancer Neg Hx     Social History Social History   Tobacco Use  . Smoking status: Never Smoker  . Smokeless tobacco: Never Used  Substance Use Topics  . Alcohol use: Yes    Alcohol/week: 0.0 - 1.0 standard drinks    Comment: rarely  . Drug use: No    No Known Allergies  Current Outpatient Medications  Medication Sig Dispense Refill  . acetaminophen (TYLENOL) 500 MG tablet Take 500 mg by mouth daily as needed for moderate pain or headache.    . allopurinol (ZYLOPRIM) 300 MG tablet Take 1 tablet (300 mg total) by mouth daily. (Patient taking differently: Take 300 mg by mouth every morning.  ) 90 tablet 1  . aspirin 81 MG tablet Take 1 tablet (81 mg total) by mouth daily. 90 tablet 3  . atorvastatin (LIPITOR) 80 MG tablet Take 1 tablet (80 mg total) by mouth daily. 90 tablet 3  . empagliflozin (JARDIANCE) 25 MG TABS tablet Take 25 mg by mouth daily. 90 tablet 3  . metFORMIN (GLUCOPHAGE XR) 500 MG 24 hr tablet Take 4 tablets (2,000 mg total) by mouth daily with breakfast. 360 tablet 2   Current Facility-Administered Medications  Medication Dose Route Frequency Provider Last Rate Last Dose  . 0.9 %  sodium chloride infusion  500 mL Intravenous Continuous Pyrtle, Lajuan Lines, MD        Review of Systems Review of Systems  Constitutional: Negative.   Respiratory: Negative.   Cardiovascular: Negative.     Blood pressure 136/74, pulse 72, resp. rate 12, height 5\' 10"  (1.778 m), weight 234 lb (106.1 kg).  Physical Exam Physical Exam  Constitutional: He is oriented to person, place, and time. He appears well-developed and well-nourished.  Abdominal:    2 stitches removed  Neurological: He is alert and oriented to person, place, and time.  Skin: Skin is warm and dry.  Assessment    Doing well post repair of ventral hernia.    Plan  Patient to return as needed. The patient is aware to call back for any questions or concerns. Proper lifting techniques reviewed.   HPI, Physical Exam, Assessment and Plan have been scribed under the direction and in the presence of Robert Bellow, MD. Karie Fetch, RN  I have completed the exam and reviewed the above documentation for accuracy and completeness.  I agree with the above.  Haematologist has been used and any errors in dictation or transcription are unintentional.  Hervey Ard, M.D., F.A.C.S. Forest Gleason Kerryn Tennant 02/02/2018, 1:50 PM

## 2018-02-01 NOTE — Patient Instructions (Signed)
Patient to return as needed. 

## 2018-02-06 ENCOUNTER — Ambulatory Visit: Payer: Federal, State, Local not specified - PPO | Admitting: Family Medicine

## 2018-04-09 ENCOUNTER — Ambulatory Visit: Payer: Federal, State, Local not specified - PPO | Admitting: Family Medicine

## 2018-04-09 ENCOUNTER — Encounter: Payer: Self-pay | Admitting: Family Medicine

## 2018-04-09 VITALS — BP 114/78 | HR 75 | Temp 98.5°F | Ht 70.0 in | Wt 255.0 lb

## 2018-04-09 DIAGNOSIS — M1A00X0 Idiopathic chronic gout, unspecified site, without tophus (tophi): Secondary | ICD-10-CM | POA: Diagnosis not present

## 2018-04-09 DIAGNOSIS — E119 Type 2 diabetes mellitus without complications: Secondary | ICD-10-CM

## 2018-04-09 DIAGNOSIS — E785 Hyperlipidemia, unspecified: Secondary | ICD-10-CM | POA: Diagnosis not present

## 2018-04-09 LAB — CBC WITH DIFFERENTIAL/PLATELET
Basophils Absolute: 0 10*3/uL (ref 0.0–0.1)
Basophils Relative: 0.3 % (ref 0.0–3.0)
EOS ABS: 0.2 10*3/uL (ref 0.0–0.7)
EOS PCT: 2.6 % (ref 0.0–5.0)
HEMATOCRIT: 47 % (ref 39.0–52.0)
HEMOGLOBIN: 15.7 g/dL (ref 13.0–17.0)
LYMPHS ABS: 1.7 10*3/uL (ref 0.7–4.0)
Lymphocytes Relative: 24.3 % (ref 12.0–46.0)
MCHC: 33.3 g/dL (ref 30.0–36.0)
MCV: 89.2 fl (ref 78.0–100.0)
MONO ABS: 0.5 10*3/uL (ref 0.1–1.0)
MONOS PCT: 7.4 % (ref 3.0–12.0)
NEUTROS ABS: 4.6 10*3/uL (ref 1.4–7.7)
Neutrophils Relative %: 65.4 % (ref 43.0–77.0)
Platelets: 206 10*3/uL (ref 150.0–400.0)
RBC: 5.27 Mil/uL (ref 4.22–5.81)
RDW: 14 % (ref 11.5–15.5)
WBC: 7 10*3/uL (ref 4.0–10.5)

## 2018-04-09 LAB — COMPREHENSIVE METABOLIC PANEL
ALBUMIN: 4.4 g/dL (ref 3.5–5.2)
ALT: 32 U/L (ref 0–53)
AST: 14 U/L (ref 0–37)
Alkaline Phosphatase: 61 U/L (ref 39–117)
BILIRUBIN TOTAL: 1.1 mg/dL (ref 0.2–1.2)
BUN: 17 mg/dL (ref 6–23)
CALCIUM: 9.8 mg/dL (ref 8.4–10.5)
CHLORIDE: 102 meq/L (ref 96–112)
CO2: 31 mEq/L (ref 19–32)
CREATININE: 1.36 mg/dL (ref 0.40–1.50)
GFR: 58.04 mL/min — AB (ref >60.00)
Glucose, Bld: 140 mg/dL — ABNORMAL HIGH (ref 70–99)
Potassium: 4 mEq/L (ref 3.5–5.1)
Sodium: 139 mEq/L (ref 135–145)
Total Protein: 7 g/dL (ref 6.0–8.3)

## 2018-04-09 LAB — LIPID PANEL
CHOLESTEROL: 166 mg/dL (ref 0–200)
HDL: 39.8 mg/dL (ref >39.00)
NonHDL: 126.42
TRIGLYCERIDES: 349 mg/dL — AB (ref 0.0–149.0)
Total CHOL/HDL Ratio: 4
VLDL: 69.8 mg/dL — ABNORMAL HIGH (ref 0.0–40.0)

## 2018-04-09 LAB — MICROALBUMIN / CREATININE URINE RATIO
CREATININE, U: 171.6 mg/dL
Microalb Creat Ratio: 0.4 mg/g (ref 0.0–30.0)

## 2018-04-09 LAB — LDL CHOLESTEROL, DIRECT: LDL DIRECT: 107 mg/dL

## 2018-04-09 LAB — HEMOGLOBIN A1C: Hgb A1c MFr Bld: 7.7 % — ABNORMAL HIGH (ref 4.6–6.5)

## 2018-04-09 LAB — URIC ACID: Uric Acid, Serum: 6.6 mg/dL (ref 4.0–7.8)

## 2018-04-09 NOTE — Assessment & Plan Note (Signed)
Check A1c.  Continue current regimen. 

## 2018-04-09 NOTE — Progress Notes (Signed)
  Tommi Rumps, MD Phone: [phone]  Jermaine Ford is a 52 y.o. male who presents today for f/u.  CC: DM, HLD, gout  DIABETES Disease Monitoring: Blood Sugar ranges-114-142 Polyuria/phagia/dipsia- no      Optho- has an appointment November 5th Medications: Compliance- taking jardiance, metformin Hypoglycemic symptoms- no  Hyperlipidemia: Notes he stopped taking Lipitor about a year ago.  He believes this was related to myalgias.  No chest pain.  He has tried to alter his eating habits with eating more balanced meals when he is at home.  It is difficult for him to exercise given his work schedule that requires lots of traveling.  Gout: He is taking allopurinol.  He has not had a recent gout flare since increasing his allopurinol dose   Social History   Tobacco Use  Smoking Status Never Smoker  Smokeless Tobacco Never Used     ROS see history of present illness  Objective  Physical Exam Vitals:   04/09/18 1405  BP: 114/78  Pulse: 75  Temp: 98.5 F (36.9 C)  SpO2: 96%    BP Readings from Last 3 Encounters:  04/09/18 114/78  02/01/18 136/74  01/02/18 120/68   Wt Readings from Last 3 Encounters:  04/09/18 255 lb (115.7 kg)  02/01/18 234 lb (106.1 kg)  01/02/18 244 lb (110.7 kg)    Physical Exam  Constitutional: No distress.  Cardiovascular: Normal rate, regular rhythm and normal heart sounds.  Pulmonary/Chest: Effort normal and breath sounds normal.  Musculoskeletal: He exhibits no edema.  Neurological: He is alert.  Skin: Skin is warm and dry. He is not diaphoretic.     Assessment/Plan: Please see individual problem list.  Type 2 diabetes mellitus without complication, without long-term current use of insulin (HCC) Check A1c.  Continue current regimen.  Hyperlipidemia Check lipid panel.  Determine need for statin therapy once this returns.  Primary gout Asymptomatic.  Continue allopurinol.  Check uric acid and CBC.  Encouraged diet and  exercise.  Patient does report prior hepatitis C and HIV screening. He also frequently donates blood.  Orders Placed This Encounter  Procedures  . Urine Microalbumin w/creat. ratio  . HgB A1c  . Comp Met (CMET)  . Lipid panel  . CBC w/Diff  . Uric acid    No orders of the defined types were placed in this encounter.    Tommi Rumps, MD [place]

## 2018-04-09 NOTE — Patient Instructions (Signed)
Nice to see you. We will get lab work today and contact you with the results. 

## 2018-04-09 NOTE — Assessment & Plan Note (Signed)
Asymptomatic.  Continue allopurinol.  Check uric acid and CBC.

## 2018-04-09 NOTE — Assessment & Plan Note (Signed)
Check lipid panel.  Determine need for statin therapy once this returns.

## 2018-04-12 ENCOUNTER — Other Ambulatory Visit: Payer: Self-pay | Admitting: Family Medicine

## 2018-04-12 DIAGNOSIS — E785 Hyperlipidemia, unspecified: Secondary | ICD-10-CM

## 2018-04-12 MED ORDER — LINAGLIPTIN 5 MG PO TABS: 5.0000 mg | ORAL_TABLET | Freq: Every day | ORAL | 3 refills | Status: DC

## 2018-04-12 MED ORDER — ROSUVASTATIN CALCIUM 20 MG PO TABS: 40.0000 mg | ORAL_TABLET | Freq: Every day | ORAL | 1 refills | Status: DC

## 2018-05-03 LAB — HM DIABETES EYE EXAM

## 2018-05-14 ENCOUNTER — Other Ambulatory Visit: Payer: Federal, State, Local not specified - PPO

## 2018-05-28 ENCOUNTER — Other Ambulatory Visit (INDEPENDENT_AMBULATORY_CARE_PROVIDER_SITE_OTHER): Payer: Federal, State, Local not specified - PPO

## 2018-05-28 DIAGNOSIS — E785 Hyperlipidemia, unspecified: Secondary | ICD-10-CM

## 2018-05-28 LAB — HEPATIC FUNCTION PANEL
ALT: 18 U/L (ref 0–53)
AST: 9 U/L (ref 0–37)
Albumin: 4.3 g/dL (ref 3.5–5.2)
Alkaline Phosphatase: 52 U/L (ref 39–117)
BILIRUBIN DIRECT: 0.2 mg/dL (ref 0.0–0.3)
Total Bilirubin: 1.2 mg/dL (ref 0.2–1.2)
Total Protein: 6.7 g/dL (ref 6.0–8.3)

## 2018-05-28 LAB — LDL CHOLESTEROL, DIRECT: Direct LDL: 31 mg/dL

## 2018-06-07 DIAGNOSIS — K080 Exfoliation of teeth due to systemic causes: Secondary | ICD-10-CM | POA: Diagnosis not present

## 2018-06-22 DIAGNOSIS — K080 Exfoliation of teeth due to systemic causes: Secondary | ICD-10-CM | POA: Diagnosis not present

## 2018-08-09 ENCOUNTER — Telehealth: Payer: Self-pay

## 2018-08-09 NOTE — Telephone Encounter (Signed)
Patient called, left VM to return call to the office for a message from Dr. Caryl Bis.

## 2018-08-09 NOTE — Telephone Encounter (Signed)
Noted.  We will discuss prescribing this during his visit.

## 2018-08-09 NOTE — Telephone Encounter (Signed)
Please contact the patient and see if he would be willing to try Januvia as the Tradjenta is not covered by his insurance.  Thanks.

## 2018-08-09 NOTE — Telephone Encounter (Signed)
PA for Tradjenta 5 Mg was REJECTED   The following medications are more likely to be covered by your patient's insurance.  * alogliptin benzoate ( TI)  * Januvia ( T2)  Sent to PCP to advise

## 2018-08-09 NOTE — Telephone Encounter (Signed)
Called pt and left a VM to call back to discuss this matter. CRM created and sent to [facility] pool.

## 2018-08-10 ENCOUNTER — Ambulatory Visit (INDEPENDENT_AMBULATORY_CARE_PROVIDER_SITE_OTHER): Payer: Federal, State, Local not specified - PPO

## 2018-08-10 ENCOUNTER — Ambulatory Visit (INDEPENDENT_AMBULATORY_CARE_PROVIDER_SITE_OTHER): Payer: Federal, State, Local not specified - PPO | Admitting: Family Medicine

## 2018-08-10 ENCOUNTER — Encounter: Payer: Self-pay | Admitting: Family Medicine

## 2018-08-10 VITALS — BP 100/62 | HR 65 | Temp 98.2°F | Ht 70.0 in | Wt 243.8 lb

## 2018-08-10 DIAGNOSIS — E785 Hyperlipidemia, unspecified: Secondary | ICD-10-CM | POA: Diagnosis not present

## 2018-08-10 DIAGNOSIS — E119 Type 2 diabetes mellitus without complications: Secondary | ICD-10-CM | POA: Diagnosis not present

## 2018-08-10 DIAGNOSIS — M5412 Radiculopathy, cervical region: Secondary | ICD-10-CM | POA: Insufficient documentation

## 2018-08-10 DIAGNOSIS — M542 Cervicalgia: Secondary | ICD-10-CM | POA: Diagnosis not present

## 2018-08-10 DIAGNOSIS — Z6834 Body mass index (BMI) 34.0-34.9, adult: Secondary | ICD-10-CM

## 2018-08-10 DIAGNOSIS — E6609 Other obesity due to excess calories: Secondary | ICD-10-CM

## 2018-08-10 LAB — BASIC METABOLIC PANEL
BUN: 23 mg/dL (ref 6–23)
CO2: 27 meq/L (ref 19–32)
CREATININE: 1.26 mg/dL (ref 0.40–1.50)
Calcium: 9.4 mg/dL (ref 8.4–10.5)
Chloride: 106 mEq/L (ref 96–112)
GFR: 59.56 mL/min — ABNORMAL LOW (ref >60.00)
Glucose, Bld: 139 mg/dL — ABNORMAL HIGH (ref 70–99)
Potassium: 5.1 mEq/L (ref 3.5–5.1)
Sodium: 142 mEq/L (ref 135–145)

## 2018-08-10 LAB — HEMOGLOBIN A1C: HEMOGLOBIN A1C: 6.8 % — AB (ref 4.6–6.5)

## 2018-08-10 MED ORDER — SITAGLIPTIN PHOSPHATE 100 MG PO TABS: 100.0000 mg | ORAL_TABLET | Freq: Every day | ORAL | 1 refills | Status: DC

## 2018-08-10 NOTE — Assessment & Plan Note (Signed)
Check A1c.  Change to Januvia.  Continue Jardiance and metformin.

## 2018-08-10 NOTE — Progress Notes (Signed)
Tommi Rumps, MD Phone: [phone]  Jermaine Ford is a 53 y.o. male who presents today for follow-up.  CC: Diabetes, hyperlipidemia, obesity, neuropathy, neck pain  Diabetes: Typically 130-135.  Taking Jardiance, metformin, and Tradjenta.  His insurance will not pay for Tradjenta anymore.  Will change to Januvia.  Some increased thirst over the last 4 weeks though he has been on the road more.  No hypoglycemia.  Hyperlipidemia: Taking Crestor.  No chest pain, right upper quadrant pain, or myalgias.  Obesity: He has been avoiding junk food and eating quite a bit better.  More fruits.  Avoiding high fructose corn syrup.  He is walking 15000-20000 steps daily.  Neck pain: This has been going on recently.  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  He notes as soon as he leaves work the pain gets better.  He does have radiation of pain to his left lateral shoulder and upper arm.  Some numbness in that distribution and tingling into that distribution as well.  No other numbness or tingling.  No weakness.  No incontinence.  He does have a history of disc herniation.  Social History   Tobacco Use  Smoking Status Never Smoker  Smokeless Tobacco Never Used     ROS see history of present illness  Objective  Physical Exam Vitals:   08/10/18 1003  BP: 100/62  Pulse: 65  Temp: 98.2 F (36.8 C)  SpO2: 95%    BP Readings from Last 3 Encounters:  08/10/18 100/62  04/09/18 114/78  02/01/18 136/74   Wt Readings from Last 3 Encounters:  08/10/18 243 lb 12.8 oz (110.6 kg)  04/09/18 255 lb (115.7 kg)  02/01/18 234 lb (106.1 kg)    Physical Exam Constitutional:      General: He is not in acute distress.    Appearance: He is not diaphoretic.  Cardiovascular:     Rate and Rhythm: Normal rate and regular rhythm.     Heart sounds: Normal heart sounds.  Pulmonary:     Effort: Pulmonary effort is normal.     Breath sounds: Normal breath sounds.    Musculoskeletal:     Comments: No midline spine tenderness, no midline spine step-off, no muscular back tenderness  Skin:    General: Skin is warm and dry.  Neurological:     Mental Status: He is alert.     Comments: 5/5 strength in bilateral biceps, triceps, grip, quads, hamstrings, plantar and dorsiflexion, sensation to light touch intact in bilateral UE and LE, normal gait      Assessment/Plan: Please see individual problem list.  Type 2 diabetes mellitus without complication, without long-term current use of insulin (HCC) Check A1c.  Change to Januvia.  Continue Jardiance and metformin.  Cervical radiculopathy Symptoms consistent with cervical radiculopathy.  They do not occur frequently enough to indicate steroid treatment currently.  We will check an x-ray and consider physical therapy.  Hyperlipidemia Continue Crestor.  Obesity Weight is trending down with increased activity level and monitoring his diet.  I encouraged him to continue this.   Orders Placed This Encounter  Procedures  . DG Cervical Spine Complete    Standing Status:   Future    Number of Occurrences:   1    Standing Expiration Date:   10/09/2019    Order Specific Question:   Reason for Exam (SYMPTOM  OR DIAGNOSIS REQUIRED)    Answer:   neck pain, radicular symptoms to left  shoulder and upper arm    Order Specific Question:   Preferred imaging location?    Answer:   [facility] Specific Question:   Radiology Contrast Protocol - do NOT remove file path    Answer:   [email or web address]  . HgB A1c  . Basic Metabolic Panel (BMET)    Meds ordered this encounter  Medications  . sitaGLIPtin (JANUVIA) 100 MG tablet    Sig: Take 1 tablet (100 mg total) by mouth daily.    Dispense:  90 tablet    Refill:  1     Tommi Rumps, MD [place]

## 2018-08-10 NOTE — Assessment & Plan Note (Signed)
Symptoms consistent with cervical radiculopathy.  They do not occur frequently enough to indicate steroid treatment currently.  We will check an x-ray and consider physical therapy.

## 2018-08-10 NOTE — Assessment & Plan Note (Signed)
Continue Crestor 

## 2018-08-10 NOTE — Assessment & Plan Note (Signed)
Weight is trending down with increased activity level and monitoring his diet.  I encouraged him to continue this.

## 2018-08-10 NOTE — Patient Instructions (Addendum)
Nice to see you. Please continue to work on diet and exercise. We will check your A1c today and contact you with the results. If you have worsening neck pain or you develop persistent numbness or you develop weakness please be evaluated immediately.

## 2018-08-14 ENCOUNTER — Other Ambulatory Visit: Payer: Self-pay | Admitting: Family Medicine

## 2018-08-14 DIAGNOSIS — M47812 Spondylosis without myelopathy or radiculopathy, cervical region: Secondary | ICD-10-CM

## 2018-08-14 DIAGNOSIS — M5412 Radiculopathy, cervical region: Secondary | ICD-10-CM

## 2018-08-17 NOTE — Telephone Encounter (Signed)
Sent to PCP ?

## 2018-09-05 ENCOUNTER — Ambulatory Visit (INDEPENDENT_AMBULATORY_CARE_PROVIDER_SITE_OTHER): Payer: Federal, State, Local not specified - PPO | Admitting: Orthopaedic Surgery

## 2018-10-11 ENCOUNTER — Other Ambulatory Visit: Payer: Self-pay | Admitting: Family Medicine

## 2018-10-15 ENCOUNTER — Telehealth: Payer: Self-pay

## 2018-10-15 MED ORDER — COLCHICINE 0.6 MG PO TABS: ORAL_TABLET | ORAL | 1 refills | Status: DC

## 2018-10-15 NOTE — Telephone Encounter (Signed)
Fax from [facility] requesting refill for colchicine   Last OV 08/10/2018  Not on pt's current medication list   Next OV 12/10/2018  Call pt do they need this refilled

## 2018-10-15 NOTE — Telephone Encounter (Signed)
Printed.  Please fax.

## 2018-10-15 NOTE — Telephone Encounter (Signed)
Pt does want this medication refill for his GOUT he had a GOUT flare over the weekend and now he is out of the medication. Pt would like this refilled.    Sent to PCP

## 2018-10-16 ENCOUNTER — Other Ambulatory Visit: Payer: Self-pay

## 2018-10-16 MED ORDER — COLCHICINE 0.6 MG PO TABS: ORAL_TABLET | ORAL | 1 refills | Status: DC

## 2018-10-16 NOTE — Progress Notes (Signed)
Reordered medication colchicine and sent it electronically to [facility].  [name],cma

## 2018-10-16 NOTE — Telephone Encounter (Signed)
Done.  Electronically.  Jelan Batterton,cma

## 2018-10-17 ENCOUNTER — Ambulatory Visit: Payer: Federal, State, Local not specified - PPO | Admitting: Neurology

## 2018-10-17 ENCOUNTER — Other Ambulatory Visit: Payer: Self-pay

## 2018-10-17 MED ORDER — COLCHICINE 0.6 MG PO TABS: ORAL_TABLET | ORAL | 1 refills | Status: DC

## 2018-10-25 ENCOUNTER — Encounter: Payer: Self-pay | Admitting: Family Medicine

## 2018-10-25 ENCOUNTER — Other Ambulatory Visit: Payer: Self-pay

## 2018-10-25 MED ORDER — ALLOPURINOL 300 MG PO TABS: 300.0000 mg | ORAL_TABLET | Freq: Every day | ORAL | 1 refills | Status: DC

## 2018-11-06 ENCOUNTER — Other Ambulatory Visit: Payer: Self-pay | Admitting: Family Medicine

## 2018-12-10 ENCOUNTER — Encounter: Payer: Self-pay | Admitting: Family Medicine

## 2018-12-10 ENCOUNTER — Ambulatory Visit (INDEPENDENT_AMBULATORY_CARE_PROVIDER_SITE_OTHER): Payer: Federal, State, Local not specified - PPO | Admitting: Family Medicine

## 2018-12-10 ENCOUNTER — Other Ambulatory Visit: Payer: Self-pay

## 2018-12-10 DIAGNOSIS — M5412 Radiculopathy, cervical region: Secondary | ICD-10-CM | POA: Diagnosis not present

## 2018-12-10 DIAGNOSIS — E119 Type 2 diabetes mellitus without complications: Secondary | ICD-10-CM

## 2018-12-10 DIAGNOSIS — E785 Hyperlipidemia, unspecified: Secondary | ICD-10-CM | POA: Diagnosis not present

## 2018-12-10 MED ORDER — BLOOD GLUCOSE MONITOR KIT: PACK | 0 refills | Status: AC

## 2018-12-10 NOTE — Progress Notes (Signed)
Virtual Visit via telephone Note  This visit type was conducted due to national recommendations for restrictions regarding the COVID-19 pandemic (e.g. social distancing).  This format is felt to be most appropriate for this patient at this time.  All issues noted in this document were discussed and addressed.  No physical exam was performed (except for noted visual exam findings with Video Visits).   I connected with Jermaine Ford today at 10:30 AM EDT by a video enabled telemedicine application or telephone and verified that I am speaking with the correct person using two identifiers. Location patient: car, not driving Location provider: work Persons participating in the virtual visit: patient, provider  I discussed the limitations, risks, security and privacy concerns of performing an evaluation and management service by telephone and the availability of in person appointments. I also discussed with the patient that there may be a patient responsible charge related to this service. The patient expressed understanding and agreed to proceed.  Interactive audio and video telecommunications were attempted between this provider and patient, however failed, due to patient having technical difficulties OR patient did not have access to video capability.  We continued and completed visit with audio only.   Reason for visit: Follow-up.  HPI: Diabetes: Typically running 140-150 when checking.  He is taking Jardiance and metformin.  Also on Januvia.  No polyuria or polydipsia.  No hypoglycemia.  Hyperlipidemia: Currently on Crestor.  No chest pain, right upper quadrant pain, or myalgias.  Cervical radiculopathy: Patient notes this is improved.  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  He notes occasionally the right side of his neck will hurt depending on how he sleeps though overall the symptoms have improved significantly.  He notes no additional numbness or tingling.  No weakness.   No incontinence issues.  His neurology appointment was canceled due to the COVID-19 pandemic.   ROS: See pertinent positives and negatives per HPI.  Past Medical History:  Diagnosis Date  . Arthritis   . Chicken pox   . Diabetes mellitus without complication ([place])   . Gout   . Herniated cervical disc    C-5/C-6    C-6/C-7  . Hyperlipidemia   . Hyperlipidemia 01/11/2016    Past Surgical History:  Procedure Laterality Date  . BACK SURGERY  2012   herniated disk replaced./C5-C7  . CHOLECYSTECTOMY  2002  . COLONOSCOPY    . UPPER GASTROINTESTINAL ENDOSCOPY    . VENTRAL HERNIA REPAIR N/A 12/25/2017   Procedure: HERNIA REPAIR VENTRAL ADULT;  Surgeon: Robert Bellow, MD;  Location: [facility] ORS;  Service: General;  Laterality: N/A;    Family History  Problem Relation Age of Onset  . Breast cancer Mother   . Diabetes Mother   . Hypertension Mother   . Arthritis Maternal Grandmother   . Hypertension Maternal Grandmother   . Diabetes Maternal Grandmother   . Lung cancer Paternal Grandmother   . Prostate cancer Paternal Uncle   . Colon cancer Neg Hx   . Esophageal cancer Neg Hx   . Pancreatic cancer Neg Hx   . Rectal cancer Neg Hx   . Stomach cancer Neg Hx     SOCIAL HX: Non-smoker.   Current Outpatient Medications:  .  allopurinol (ZYLOPRIM) 300 MG tablet, Take 1 tablet (300 mg total) by mouth daily., Disp: 90 tablet, Rfl: 1 .  aspirin 81 MG tablet, Take 1 tablet (81 mg total) by mouth daily., Disp: 90 tablet, Rfl: 3 .  colchicine 0.6 MG tablet, Take 1.2 mg (2 tablets) PO at the first sign of flare, followed in 1 hour with a single dose of 0.6 mg (1 tablet) PO, starting the next day take 0.6 mg (1 tablet) PO daily until gout flare has resolved, Disp: 30 tablet, Rfl: 1 .  JARDIANCE 25 MG TABS tablet, TAKE ONE TABLET BY MOUTH DAILY, Disp: 90 tablet, Rfl: 1 .  metFORMIN (GLUCOPHAGE-XR) 500 MG 24 hr tablet, TAKE FOUR TABLETS EVERY DAY WITH BREAKFAST, Disp: 360 tablet, Rfl: 2 .   rosuvastatin (CRESTOR) 20 MG tablet, Take 2 tablets (40 mg total) by mouth daily., Disp: 90 tablet, Rfl: 1 .  sitaGLIPtin (JANUVIA) 100 MG tablet, Take 1 tablet (100 mg total) by mouth daily., Disp: 90 tablet, Rfl: 1 .  acetaminophen (TYLENOL) 500 MG tablet, Take 500 mg by mouth daily as needed for moderate pain or headache., Disp: , Rfl:  .  blood glucose meter kit and supplies KIT, Dispense based on patient and insurance preference.  Check glucose fasting daily (ICD 10 code E11.9)., Disp: 1 each, Rfl: 0  Current Facility-Administered Medications:  .  0.9 %  sodium chloride infusion, 500 mL, Intravenous, Continuous, Pyrtle, Lajuan Lines, MD  EXAM: This was a telehealth telephone visit and thus no physical exam was completed.   ASSESSMENT AND PLAN:  Discussed the following assessment and plan:  Type 2 diabetes mellitus without complication, without long-term current use of insulin (HCC) Continue current medications.  [place] office staff will contact him to schedule him for labs in 1 to 2 months.  Cervical radiculopathy Symptoms have resolved.  Given his history of cervical fusion I do think it would be worthwhile having him see neurology for evaluation at some point in the future.  I have advised him to contact neurology to schedule an appointment when he is ready.  Given lack of symptoms currently I do not think this is an urgent matter though if his symptoms recur he will contact us right away.  Hyperlipidemia Continue Crestor.  [place] office staff will contact the patient to schedule labs for 1 month and follow-up in 4 months.  Social distancing precautions and sick precautions given regarding COVID-19.   I discussed the assessment and treatment plan with the patient. The patient was provided an opportunity to ask questions and all were answered. The patient agreed with the plan and demonstrated an understanding of the instructions.   The patient was advised to call back or seek an in-person  evaluation if the symptoms worsen or if the condition fails to improve as anticipated.  I provided 18 minutes of non-face-to-face time during this encounter.   Tommi Rumps, MD

## 2018-12-10 NOTE — Assessment & Plan Note (Signed)
Symptoms have resolved.  Given his history of cervical fusion I do think it would be worthwhile having him see neurology for evaluation at some point in the future.  I have advised him to contact neurology to schedule an appointment when he is ready.  Given lack of symptoms currently I do not think this is an urgent matter though if his symptoms recur he will contact us right away.

## 2018-12-10 NOTE — Assessment & Plan Note (Addendum)
Continue current medications.  [place] office staff will contact him to schedule him for labs in 1 to 2 months.

## 2018-12-10 NOTE — Assessment & Plan Note (Signed)
Continue Crestor 

## 2019-01-25 ENCOUNTER — Other Ambulatory Visit: Payer: Self-pay | Admitting: Family Medicine

## 2019-01-26 ENCOUNTER — Encounter: Payer: Self-pay | Admitting: Internal Medicine

## 2019-02-15 ENCOUNTER — Other Ambulatory Visit: Payer: Self-pay | Admitting: Family Medicine

## 2019-02-26 DIAGNOSIS — E669 Obesity, unspecified: Secondary | ICD-10-CM | POA: Diagnosis not present

## 2019-02-26 DIAGNOSIS — Z713 Dietary counseling and surveillance: Secondary | ICD-10-CM | POA: Diagnosis not present

## 2019-02-26 DIAGNOSIS — E119 Type 2 diabetes mellitus without complications: Secondary | ICD-10-CM | POA: Diagnosis not present

## 2019-03-14 DIAGNOSIS — E669 Obesity, unspecified: Secondary | ICD-10-CM | POA: Diagnosis not present

## 2019-03-14 DIAGNOSIS — Z713 Dietary counseling and surveillance: Secondary | ICD-10-CM | POA: Diagnosis not present

## 2019-03-14 DIAGNOSIS — E119 Type 2 diabetes mellitus without complications: Secondary | ICD-10-CM | POA: Diagnosis not present

## 2019-03-15 DIAGNOSIS — H1033 Unspecified acute conjunctivitis, bilateral: Secondary | ICD-10-CM | POA: Diagnosis not present

## 2019-03-25 LAB — HM DIABETES EYE EXAM

## 2019-04-10 DIAGNOSIS — E669 Obesity, unspecified: Secondary | ICD-10-CM | POA: Diagnosis not present

## 2019-04-10 DIAGNOSIS — E119 Type 2 diabetes mellitus without complications: Secondary | ICD-10-CM | POA: Diagnosis not present

## 2019-04-10 DIAGNOSIS — Z713 Dietary counseling and surveillance: Secondary | ICD-10-CM | POA: Diagnosis not present

## 2019-04-17 ENCOUNTER — Other Ambulatory Visit: Payer: Self-pay | Admitting: Family Medicine

## 2019-04-24 ENCOUNTER — Other Ambulatory Visit: Payer: Self-pay

## 2019-04-26 ENCOUNTER — Ambulatory Visit: Payer: Federal, State, Local not specified - PPO | Admitting: Family Medicine

## 2019-04-26 ENCOUNTER — Encounter: Payer: Self-pay | Admitting: Family Medicine

## 2019-04-26 ENCOUNTER — Other Ambulatory Visit: Payer: Self-pay

## 2019-04-26 VITALS — BP 130/70 | HR 66 | Temp 97.9°F | Ht 70.0 in | Wt 249.6 lb

## 2019-04-26 DIAGNOSIS — E119 Type 2 diabetes mellitus without complications: Secondary | ICD-10-CM

## 2019-04-26 DIAGNOSIS — M1A00X0 Idiopathic chronic gout, unspecified site, without tophus (tophi): Secondary | ICD-10-CM | POA: Diagnosis not present

## 2019-04-26 DIAGNOSIS — K429 Umbilical hernia without obstruction or gangrene: Secondary | ICD-10-CM

## 2019-04-26 DIAGNOSIS — E785 Hyperlipidemia, unspecified: Secondary | ICD-10-CM

## 2019-04-26 DIAGNOSIS — Z1211 Encounter for screening for malignant neoplasm of colon: Secondary | ICD-10-CM

## 2019-04-26 LAB — MICROALBUMIN / CREATININE URINE RATIO
Creatinine,U: 113.3 mg/dL
Microalb Creat Ratio: 0.6 mg/g (ref 0.0–30.0)
Microalb, Ur: <0.7 mg/dL (ref 0.0–1.9)

## 2019-04-26 LAB — CBC WITH DIFFERENTIAL/PLATELET
Basophils Absolute: 0 10*3/uL (ref 0.0–0.1)
Basophils Relative: 0.5 % (ref 0.0–3.0)
Eosinophils Absolute: 0.2 10*3/uL (ref 0.0–0.7)
Eosinophils Relative: 2.9 % (ref 0.0–5.0)
HCT: 49.2 % (ref 39.0–52.0)
Hemoglobin: 16.3 g/dL (ref 13.0–17.0)
Lymphocytes Relative: 26.5 % (ref 12.0–46.0)
Lymphs Abs: 1.6 10*3/uL (ref 0.7–4.0)
MCHC: 33.2 g/dL (ref 30.0–36.0)
MCV: 91.9 fl (ref 78.0–100.0)
Monocytes Absolute: 0.5 10*3/uL (ref 0.1–1.0)
Monocytes Relative: 8.1 % (ref 3.0–12.0)
Neutro Abs: 3.7 10*3/uL (ref 1.4–7.7)
Neutrophils Relative %: 62 % (ref 43.0–77.0)
Platelets: 220 10*3/uL (ref 150.0–400.0)
RBC: 5.35 Mil/uL (ref 4.22–5.81)
RDW: 14 % (ref 11.5–15.5)
WBC: 5.9 10*3/uL (ref 4.0–10.5)

## 2019-04-26 LAB — COMPREHENSIVE METABOLIC PANEL
ALT: 26 U/L (ref 0–53)
AST: 16 U/L (ref 0–37)
Albumin: 4.8 g/dL (ref 3.5–5.2)
Alkaline Phosphatase: 56 U/L (ref 39–117)
BUN: 23 mg/dL (ref 6–23)
CO2: 28 mEq/L (ref 19–32)
Calcium: 9.8 mg/dL (ref 8.4–10.5)
Chloride: 105 mEq/L (ref 96–112)
Creatinine, Ser: 1.22 mg/dL (ref 0.40–1.50)
GFR: 61.66 mL/min (ref >60.00)
Glucose, Bld: 170 mg/dL — ABNORMAL HIGH (ref 70–99)
Potassium: 5 mEq/L (ref 3.5–5.1)
Sodium: 140 mEq/L (ref 135–145)
Total Bilirubin: 1.5 mg/dL — ABNORMAL HIGH (ref 0.2–1.2)
Total Protein: 7 g/dL (ref 6.0–8.3)

## 2019-04-26 LAB — LIPID PANEL
Cholesterol: 151 mg/dL (ref 0–200)
HDL: 36.9 mg/dL — ABNORMAL LOW (ref >39.00)
LDL Cholesterol: 85 mg/dL (ref 0–99)
NonHDL: 114.27
Total CHOL/HDL Ratio: 4
Triglycerides: 144 mg/dL (ref 0.0–149.0)
VLDL: 28.8 mg/dL (ref 0.0–40.0)

## 2019-04-26 LAB — HEMOGLOBIN A1C: Hgb A1c MFr Bld: 6.5 % (ref 4.6–6.5)

## 2019-04-26 LAB — URIC ACID: Uric Acid, Serum: 6.2 mg/dL (ref 4.0–7.8)

## 2019-04-26 MED ORDER — COLCHICINE 0.6 MG PO TABS: ORAL_TABLET | ORAL | 1 refills | Status: DC

## 2019-04-26 MED ORDER — ROSUVASTATIN CALCIUM 40 MG PO TABS: 40.0000 mg | ORAL_TABLET | Freq: Every day | ORAL | 1 refills | Status: DC

## 2019-04-26 NOTE — Patient Instructions (Signed)
Nice to see you. Please continue with dietary changes. We will check lab work today and contact you with the results and determine medication changes for your diabetes.

## 2019-04-26 NOTE — Assessment & Plan Note (Signed)
Seems to be slightly uncontrolled based on home CBGs.  We will check an A1c.  Discussed changing him to a medication such as Trulicity or Ozempic.  We will make that determination once his labs return.  Continue current medications for now.

## 2019-04-26 NOTE — Assessment & Plan Note (Signed)
Asymptomatic.  Continue allopurinol.  Colchicine refilled.  Check uric acid and CBC.

## 2019-04-26 NOTE — Assessment & Plan Note (Signed)
Status post intervention for this.  He is not having any issues after surgery.

## 2019-04-26 NOTE — Progress Notes (Signed)
Tommi Rumps, MD Phone: [phone]  Jermaine Ford is a 53 y.o. male who presents today for f/u.  Diabetes: Typically 130-160.  He notes it was 160 this morning and 140 last night 1 hour after he ate.  Excursions to the 200s.  Taking Jardiance, Januvia, and metformin.  No polyuria or polydipsia.  No hypoglycemia.  He has been seeing a nutritionist and has cut out most of the sugar and high carbohydrate foods.  He is having 1 soda per week.  Hyperlipidemia: Has been off of his Crestor.  He ran out.  No chest pain or claudication.  He had no issues with right upper quadrant pain or myalgias while on Crestor.  Gout: He continues on allopurinol.  Takes the colchicine as needed for gout flares.  His last gout flare was a year ago.  Umbilical hernia: Patient underwent repair previously for this.  He has had no issues since this was repaired.  No pain or bulging at the site of the original hernia.  Social History   Tobacco Use  Smoking Status Never Smoker  Smokeless Tobacco Never Used     ROS see history of present illness  Objective  Physical Exam Vitals:   04/26/19 0920  BP: 130/70  Pulse: 66  Temp: 97.9 F (36.6 C)  SpO2: 99%    BP Readings from Last 3 Encounters:  04/26/19 130/70  08/10/18 100/62  04/09/18 114/78   Wt Readings from Last 3 Encounters:  04/26/19 249 lb 9.6 oz (113.2 kg)  08/10/18 243 lb 12.8 oz (110.6 kg)  04/09/18 255 lb (115.7 kg)    Physical Exam Constitutional:      General: He is not in acute distress.    Appearance: He is not diaphoretic.  Cardiovascular:     Rate and Rhythm: Normal rate and regular rhythm.     Heart sounds: Normal heart sounds.  Pulmonary:     Effort: Pulmonary effort is normal.     Breath sounds: Normal breath sounds.  Musculoskeletal:     Right lower leg: No edema.     Left lower leg: No edema.  Skin:    General: Skin is warm and dry.  Neurological:     Mental Status: He is alert.      Assessment/Plan:  Please see individual problem list.  Type 2 diabetes mellitus without complication, without long-term current use of insulin ([place]) Seems to be slightly uncontrolled based on home CBGs.  We will check an A1c.  Discussed changing him to a medication such as Trulicity or Ozempic.  We will make that determination once his labs return.  Continue current medications for now.  Hyperlipidemia Restart Crestor.  Check lipid panel today.  Primary gout Asymptomatic.  Continue allopurinol.  Colchicine refilled.  Check uric acid and CBC.  Umbilical hernia Status post intervention for this.  He is not having any issues after surgery.   Health Maintenance: Refer for colonoscopy.  Orders Placed This Encounter  Procedures  . Comp Met (CMET)  . HgB A1c  . Urine Microalbumin w/creat. ratio  . Uric acid  . CBC w/Diff  . Lipid panel  . Ambulatory referral to Gastroenterology    Referral Priority:   Routine    Referral Type:   Consultation    Referral Reason:   Specialty Services Required    Number of Visits Requested:   1    Meds ordered this encounter  Medications  . colchicine 0.6 MG tablet    Sig: Take 1.2  mg (2 tablets) PO at the first sign of flare, followed in 1 hour with a single dose of 0.6 mg (1 tablet) PO, starting the next day take 0.6 mg (1 tablet) PO daily until gout flare has resolved    Dispense:  30 tablet    Refill:  1  . rosuvastatin (CRESTOR) 40 MG tablet    Sig: Take 1 tablet (40 mg total) by mouth daily.    Dispense:  90 tablet    Refill:  1     Tommi Rumps, MD [place]

## 2019-04-26 NOTE — Assessment & Plan Note (Signed)
Restart Crestor.  Check lipid panel today.

## 2019-04-27 ENCOUNTER — Other Ambulatory Visit: Payer: Self-pay | Admitting: Family Medicine

## 2019-04-27 DIAGNOSIS — R17 Unspecified jaundice: Secondary | ICD-10-CM

## 2019-04-30 ENCOUNTER — Telehealth: Payer: Self-pay | Admitting: *Deleted

## 2019-04-30 MED ORDER — EZETIMIBE 10 MG PO TABS: 10.0000 mg | ORAL_TABLET | Freq: Every day | ORAL | 3 refills | Status: DC

## 2019-04-30 NOTE — Telephone Encounter (Signed)
Patient called for results: Notes recorded by Leone Haven, MD on 04/27/2019 at 10:18 AM EDT  Please call the patient. His LDL cholesterol is slightly above goal. I would suggest we add Zetia to his regimen to help get it to less than 70. Please send in Zetia 10 mg once daily by mouth with 90 tablets and 1 refill if he is willing to start on this. His bilirubin is mildly elevated. I would like to recheck this in 1 to 2 weeks. Orders have been placed. His A1c is well controlled. He can continue with his current diabetic regimen. Thanks.  Patient notified of results and PCP recommendation. Lab repeat has been scheduled. Patient is agreeable to Zetia and would like Rx sent to [facility] pharmacy. Note sent to office for Rx fill.

## 2019-04-30 NOTE — Addendum Note (Signed)
Addended by: Leone Haven on: 04/30/2019 10:28 AM   Modules accepted: Orders

## 2019-04-30 NOTE — Telephone Encounter (Signed)
Completed.

## 2019-05-01 ENCOUNTER — Encounter: Payer: Self-pay | Admitting: Internal Medicine

## 2019-05-08 ENCOUNTER — Other Ambulatory Visit (INDEPENDENT_AMBULATORY_CARE_PROVIDER_SITE_OTHER): Payer: Federal, State, Local not specified - PPO

## 2019-05-08 ENCOUNTER — Other Ambulatory Visit: Payer: Self-pay

## 2019-05-08 ENCOUNTER — Other Ambulatory Visit: Payer: Self-pay | Admitting: Family Medicine

## 2019-05-08 DIAGNOSIS — R17 Unspecified jaundice: Secondary | ICD-10-CM

## 2019-05-08 LAB — HEPATIC FUNCTION PANEL
ALT: 18 U/L (ref 0–53)
AST: 12 U/L (ref 0–37)
Albumin: 4.7 g/dL (ref 3.5–5.2)
Alkaline Phosphatase: 51 U/L (ref 39–117)
Bilirubin, Direct: 0.3 mg/dL (ref 0.0–0.3)
Total Bilirubin: 1.4 mg/dL — ABNORMAL HIGH (ref 0.2–1.2)
Total Protein: 6.7 g/dL (ref 6.0–8.3)

## 2019-05-08 LAB — BILIRUBIN, FRACTIONATED(TOT/DIR/INDIR)
Bilirubin, Direct: 0.3 mg/dL — ABNORMAL HIGH (ref 0.0–0.2)
Indirect Bilirubin: 1.1 mg/dL (calc) (ref 0.2–1.2)
Total Bilirubin: 1.4 mg/dL — ABNORMAL HIGH (ref 0.2–1.2)

## 2019-05-09 ENCOUNTER — Other Ambulatory Visit (INDEPENDENT_AMBULATORY_CARE_PROVIDER_SITE_OTHER): Payer: Federal, State, Local not specified - PPO

## 2019-05-09 LAB — CBC
HCT: 45.6 % (ref 39.0–52.0)
Hemoglobin: 15.5 g/dL (ref 13.0–17.0)
MCHC: 34 g/dL (ref 30.0–36.0)
MCV: 90.7 fl (ref 78.0–100.0)
Platelets: 204 10*3/uL (ref 150.0–400.0)
RBC: 5.03 Mil/uL (ref 4.22–5.81)
RDW: 13.2 % (ref 11.5–15.5)
WBC: 6.2 10*3/uL (ref 4.0–10.5)

## 2019-05-10 ENCOUNTER — Other Ambulatory Visit: Payer: Self-pay | Admitting: Family Medicine

## 2019-05-10 LAB — RETICULOCYTES
ABS Retic: 97850 cells/uL — ABNORMAL HIGH (ref 25000–9000)
Retic Ct Pct: 1.9 %

## 2019-05-10 LAB — HAPTOGLOBIN: Haptoglobin: 45 mg/dL (ref 43–212)

## 2019-05-15 DIAGNOSIS — Z713 Dietary counseling and surveillance: Secondary | ICD-10-CM | POA: Diagnosis not present

## 2019-05-15 DIAGNOSIS — E669 Obesity, unspecified: Secondary | ICD-10-CM | POA: Diagnosis not present

## 2019-05-15 DIAGNOSIS — E119 Type 2 diabetes mellitus without complications: Secondary | ICD-10-CM | POA: Diagnosis not present

## 2019-05-20 ENCOUNTER — Telehealth: Payer: Self-pay | Admitting: Family Medicine

## 2019-05-20 DIAGNOSIS — R701 Abnormal plasma viscosity: Secondary | ICD-10-CM

## 2019-05-20 NOTE — Telephone Encounter (Signed)
Telephone encounter for results. Result note read to pt; agreeable to referral.

## 2019-05-21 NOTE — Telephone Encounter (Signed)
Notes recorded by Leone Haven, MD on 05/10/2019 at 4:27 PM EST  Please let the patient know that the production of his red blood cells appears to be increased. This could be resulting from a cause for his elevated bilirubin. I would like for him to see hematology for further evaluation to see if they can identify a cause for this and his elevated bilirubin. We can place a referral if he is willing. Thanks.  Patient is willing to have the referral to hematology, can you put in the referral?  Thanks. Dawson Hollman,cma

## 2019-05-22 NOTE — Telephone Encounter (Signed)
Dr Caryl Bis will be back on Monday .  This referral can wait until then

## 2019-05-23 NOTE — Addendum Note (Signed)
Addended by: Caryl Bis, Jeffrey Voth G on: 05/23/2019 10:20 AM   Modules accepted: Orders

## 2019-05-23 NOTE — Telephone Encounter (Signed)
Referral placed.

## 2019-05-30 ENCOUNTER — Encounter (HOSPITAL_COMMUNITY): Payer: Self-pay | Admitting: Surgery

## 2019-05-30 ENCOUNTER — Other Ambulatory Visit: Payer: Self-pay

## 2019-05-30 ENCOUNTER — Inpatient Hospital Stay (HOSPITAL_COMMUNITY): Payer: Federal, State, Local not specified - PPO | Attending: Hematology | Admitting: Hematology

## 2019-05-30 ENCOUNTER — Encounter (HOSPITAL_COMMUNITY): Payer: Self-pay | Admitting: Hematology

## 2019-05-30 ENCOUNTER — Inpatient Hospital Stay (HOSPITAL_COMMUNITY): Payer: Federal, State, Local not specified - PPO

## 2019-05-30 VITALS — BP 121/66 | HR 72 | Temp 97.1°F | Resp 18 | Ht 70.0 in | Wt 254.9 lb

## 2019-05-30 DIAGNOSIS — M109 Gout, unspecified: Secondary | ICD-10-CM

## 2019-05-30 DIAGNOSIS — Z8249 Family history of ischemic heart disease and other diseases of the circulatory system: Secondary | ICD-10-CM | POA: Diagnosis not present

## 2019-05-30 DIAGNOSIS — R701 Abnormal plasma viscosity: Secondary | ICD-10-CM | POA: Insufficient documentation

## 2019-05-30 DIAGNOSIS — Z801 Family history of malignant neoplasm of trachea, bronchus and lung: Secondary | ICD-10-CM | POA: Diagnosis not present

## 2019-05-30 DIAGNOSIS — D589 Hereditary hemolytic anemia, unspecified: Secondary | ICD-10-CM

## 2019-05-30 DIAGNOSIS — E669 Obesity, unspecified: Secondary | ICD-10-CM | POA: Diagnosis not present

## 2019-05-30 DIAGNOSIS — M1990 Unspecified osteoarthritis, unspecified site: Secondary | ICD-10-CM | POA: Diagnosis not present

## 2019-05-30 DIAGNOSIS — Z833 Family history of diabetes mellitus: Secondary | ICD-10-CM | POA: Diagnosis not present

## 2019-05-30 DIAGNOSIS — Z8261 Family history of arthritis: Secondary | ICD-10-CM | POA: Diagnosis not present

## 2019-05-30 DIAGNOSIS — Z803 Family history of malignant neoplasm of breast: Secondary | ICD-10-CM | POA: Diagnosis not present

## 2019-05-30 DIAGNOSIS — Z79899 Other long term (current) drug therapy: Secondary | ICD-10-CM

## 2019-05-30 DIAGNOSIS — Z7984 Long term (current) use of oral hypoglycemic drugs: Secondary | ICD-10-CM

## 2019-05-30 DIAGNOSIS — Z808 Family history of malignant neoplasm of other organs or systems: Secondary | ICD-10-CM | POA: Diagnosis not present

## 2019-05-30 DIAGNOSIS — E119 Type 2 diabetes mellitus without complications: Secondary | ICD-10-CM | POA: Diagnosis not present

## 2019-05-30 DIAGNOSIS — E785 Hyperlipidemia, unspecified: Secondary | ICD-10-CM | POA: Diagnosis not present

## 2019-05-30 LAB — HEPATIC FUNCTION PANEL
ALT: 31 U/L (ref 0–44)
AST: 15 U/L (ref 15–41)
Albumin: 4.4 g/dL (ref 3.5–5.0)
Alkaline Phosphatase: 55 U/L (ref 38–126)
Bilirubin, Direct: 0.3 mg/dL — ABNORMAL HIGH (ref 0.0–0.2)
Indirect Bilirubin: 1.4 mg/dL — ABNORMAL HIGH (ref 0.3–0.9)
Total Bilirubin: 1.7 mg/dL — ABNORMAL HIGH (ref 0.3–1.2)
Total Protein: 7 g/dL (ref 6.5–8.1)

## 2019-05-30 LAB — CBC WITH DIFFERENTIAL/PLATELET
Abs Immature Granulocytes: 0.03 10*3/uL (ref 0.00–0.07)
Basophils Absolute: 0 10*3/uL (ref 0.0–0.1)
Basophils Relative: 0 %
Eosinophils Absolute: 0.1 10*3/uL (ref 0.0–0.5)
Eosinophils Relative: 2 %
HCT: 48.3 % (ref 39.0–52.0)
Hemoglobin: 15.5 g/dL (ref 13.0–17.0)
Immature Granulocytes: 0 %
Lymphocytes Relative: 27 %
Lymphs Abs: 2 10*3/uL (ref 0.7–4.0)
MCH: 30.4 pg (ref 26.0–34.0)
MCHC: 32.1 g/dL (ref 30.0–36.0)
MCV: 94.7 fL (ref 80.0–100.0)
Monocytes Absolute: 0.5 10*3/uL (ref 0.1–1.0)
Monocytes Relative: 7 %
Neutro Abs: 4.6 10*3/uL (ref 1.7–7.7)
Neutrophils Relative %: 64 %
Platelets: 209 10*3/uL (ref 150–400)
RBC: 5.1 MIL/uL (ref 4.22–5.81)
RDW: 12.8 % (ref 11.5–15.5)
WBC: 7.3 10*3/uL (ref 4.0–10.5)
nRBC: 0 % (ref 0.0–0.2)

## 2019-05-30 LAB — LACTATE DEHYDROGENASE: LDH: 115 U/L (ref 98–192)

## 2019-05-30 LAB — RETIC PANEL
Immature Retic Fract: 13.6 % (ref 2.3–15.9)
RBC.: 5.1 MIL/uL (ref 4.22–5.81)
Retic Count, Absolute: 84.7 10*3/uL (ref 19.0–186.0)
Retic Ct Pct: 1.7 % (ref 0.4–3.1)
Reticulocyte Hemoglobin: 35.3 pg (ref >27.9)

## 2019-05-30 NOTE — Assessment & Plan Note (Addendum)
1. Reticulocytosis -Labs performed by Dr. Caryl Bis in November 2020 revealed a reticulocyte count of 97,850  Cells/uL, recheck percentage 1.9, 45, hemoglobin within normal at 15.5 and hematocrit within normal at 45.6.  -No history of recent major bleed.  No recent erythropoietin administration or vitamin B12.  No recent surgeries or major infections. -Elevated reticulocyte count is a common finding in hemolytic anemia.  Review of patient's lab do not suggest he has any hemolysis.  Recommend we complete lab studies on patient including repeat reticulocyte count, LDH, CBC, LFT panel.  We will also request pathology for review of blood smear to accurately count reticulocytes. -No intervention indicated at this time. -Patient will follow up in 2 weeks with virtual visit.  2.  Indirect hyperbilirubinemia: -Bilirubin noted to be elevated on routine blood draw with PCP at 1.4, AST and ALT within normal. -Reticulocytosis elevated bilirubin are most likely not associated. -Several of the patient's medications are known to cause elevation in bilirubin.  Patient reports starting Januvia earlier this year.  Ezetimibe was started in October. -If elevated bilirubin continues would recommend abdominal ultrasound to rule out hepatic steatosis.

## 2019-05-30 NOTE — Progress Notes (Signed)
[facility] Initial Visit:  Patient Care Team: Leone Haven, MD as PCP - General (Family Medicine)  CHIEF COMPLAINTS/PURPOSE OF CONSULTATION: - Reticulocytosis    HISTORY OF PRESENTING ILLNESS: Jermaine Ford 53 y.o. male presents today for consult regarding elevated reticulocyte count.  Past medical history significant for hyperlipidemia, gout, diabetes type 2, and arthritis.  In routine follow-up with PCP patient was found to have an elevated bilirubin of 1.4, AST and ALT were within normal.  Primary care provider then checked a retic count.  Absolute reticulocyte count was noted to be elevated at 97,850 cells/uL, reticulocyte percent was 1.9, haptoglobin within normal at 45 mg/dL.  Hemoglobin and hematocrit within normal.  He denies any recent major bleed or transfusions.  Denies any recent infections.  No recent surgeries.  He states overall he feels well.  Denies any significant fatigue.  He has never been told if he has a fatty liver.  Denies any family history of blood disorders or malignancy.  Denies any history of alcohol, tobacco abuse, or illicit drug use.  Review of Systems  All other systems reviewed and are negative.   MEDICAL HISTORY: Past Medical History:  Diagnosis Date  . Arthritis   . Chicken pox   . Diabetes mellitus without complication ([place])   . Gout   . Herniated cervical disc    C-5/C-6    C-6/C-7  . Hyperlipidemia   . Hyperlipidemia 01/11/2016  . Umbilical hernia 6/72/0947    SURGICAL HISTORY: Past Surgical History:  Procedure Laterality Date  . BACK SURGERY  2012   herniated disk replaced./C5-C7  . CHOLECYSTECTOMY  2002  . COLONOSCOPY    . UPPER GASTROINTESTINAL ENDOSCOPY    . VENTRAL HERNIA REPAIR N/A 12/25/2017   Procedure: HERNIA REPAIR VENTRAL ADULT;  Surgeon: Robert Bellow, MD;  Location: [facility] ORS;  Service: General;  Laterality: N/A;    SOCIAL HISTORY: Social History   Socioeconomic History  . Marital  status: Married    Spouse name: Not on file  . Number of children: 2  . Years of education: Not on file  . Highest education level: Not on file  Occupational History    Employer: [employer]  Social Needs  . Financial resource strain: Not on file  . Food insecurity    Worry: Not on file    Inability: Not on file  . Transportation needs    Medical: Not on file    Non-medical: Not on file  Tobacco Use  . Smoking status: Never Smoker  . Smokeless tobacco: Never Used  Substance and Sexual Activity  . Alcohol use: Yes    Alcohol/week: 0.0 - 1.0 standard drinks    Comment: rarely  . Drug use: No  . Sexual activity: Not on file  Lifestyle  . Physical activity    Days per week: Not on file    Minutes per session: Not on file  . Stress: Not on file  Relationships  . Social Herbalist on phone: Not on file    Gets together: Not on file    Attends religious service: Not on file    Active member of club or organization: Not on file    Attends meetings of clubs or organizations: Not on file    Relationship status: Not on file  . Intimate partner violence    Fear of current or ex partner: Not on file    Emotionally abused: Not on file    Physically  abused: Not on file    Forced sexual activity: Not on file  Other Topics Concern  . Not on file  Social History Narrative  . Not on file    FAMILY HISTORY Family History  Problem Relation Age of Onset  . Breast cancer Mother   . Diabetes Mother   . Hypertension Mother   . Brain cancer Mother   . Arthritis Maternal Grandmother   . Hypertension Maternal Grandmother   . Diabetes Maternal Grandmother   . Lung cancer Paternal Grandmother   . Prostate cancer Paternal Uncle   . Colon cancer Neg Hx   . Esophageal cancer Neg Hx   . Pancreatic cancer Neg Hx   . Rectal cancer Neg Hx   . Stomach cancer Neg Hx     ALLERGIES:  has No Known Allergies.  MEDICATIONS:  Current Outpatient Medications  Medication Sig Dispense Refill   . acetaminophen (TYLENOL) 500 MG tablet Take 500 mg by mouth daily as needed for moderate pain or headache.    . allopurinol (ZYLOPRIM) 300 MG tablet TAKE ONE TABLET BY MOUTH EVERY DAY 90 tablet 1  . aspirin 81 MG tablet Take 1 tablet (81 mg total) by mouth daily. 90 tablet 3  . blood glucose meter kit and supplies KIT Dispense based on patient and insurance preference.  Check glucose fasting daily (ICD 10 code E11.9). 1 each 0  . colchicine 0.6 MG tablet Take 1.2 mg (2 tablets) PO at the first sign of flare, followed in 1 hour with a single dose of 0.6 mg (1 tablet) PO, starting the next day take 0.6 mg (1 tablet) PO daily until gout flare has resolved 30 tablet 1  . ezetimibe (ZETIA) 10 MG tablet Take 1 tablet (10 mg total) by mouth daily. 90 tablet 3  . JANUVIA 100 MG tablet TAKE 1 TABLET BY MOUTH DAILY 90 tablet 1  . JARDIANCE 25 MG TABS tablet TAKE ONE TABLET EVERY DAY 90 tablet 0  . metFORMIN (GLUCOPHAGE-XR) 500 MG 24 hr tablet TAKE FOUR TABLETS BY MOUTH EVERY DAY WITH BREAKFAST 360 tablet 2  . OneTouch Delica Lancets 02B MISC     . ONETOUCH VERIO test strip      Current Facility-Administered Medications  Medication Dose Route Frequency Provider Last Rate Last Dose  . 0.9 %  sodium chloride infusion  500 mL Intravenous Continuous Pyrtle, Lajuan Lines, MD        PHYSICAL EXAMINATION:  ECOG PERFORMANCE STATUS: 0 - Asymptomatic   Vitals:   05/30/19 1320  BP: 121/66  Pulse: 72  Resp: 18  Temp: (!) 97.1 F (36.2 C)  SpO2: 98%    Filed Weights   05/30/19 1320  Weight: 254 lb 14.4 oz (115.6 kg)     Physical Exam Constitutional:      Appearance: Normal appearance. He is obese.  HENT:     Head: Normocephalic.     Right Ear: External ear normal.     Left Ear: External ear normal.     Mouth/Throat:     Pharynx: Oropharynx is clear.  Eyes:     Conjunctiva/sclera: Conjunctivae normal.  Neck:     Musculoskeletal: Normal range of motion.  Cardiovascular:     Rate and Rhythm:  Normal rate and regular rhythm.     Pulses: Normal pulses.     Heart sounds: Normal heart sounds.  Pulmonary:     Effort: Pulmonary effort is normal.     Breath sounds: Normal breath sounds.  Abdominal:  General: Bowel sounds are normal.  Musculoskeletal: Normal range of motion.  Skin:    General: Skin is warm.  Neurological:     General: No focal deficit present.     Mental Status: He is alert and oriented to person, place, and time.  Psychiatric:        Mood and Affect: Mood normal.        Behavior: Behavior normal.      LABORATORY DATA: I have personally reviewed the data as listed:  Appointment on 05/30/2019  Component Date Value Ref Range Status  . WBC 05/30/2019 7.3  4.0 - 10.5 K/uL Final  . RBC 05/30/2019 5.10  4.22 - 5.81 MIL/uL Final  . Hemoglobin 05/30/2019 15.5  13.0 - 17.0 g/dL Final  . HCT 05/30/2019 48.3  39.0 - 52.0 % Final  . MCV 05/30/2019 94.7  80.0 - 100.0 fL Final  . MCH 05/30/2019 30.4  26.0 - 34.0 pg Final  . MCHC 05/30/2019 32.1  30.0 - 36.0 g/dL Final  . RDW 05/30/2019 12.8  11.5 - 15.5 % Final  . Platelets 05/30/2019 209  150 - 400 K/uL Final  . nRBC 05/30/2019 0.0  0.0 - 0.2 % Final  . Neutrophils Relative % 05/30/2019 64  % Final  . Neutro Abs 05/30/2019 4.6  1.7 - 7.7 K/uL Final  . Lymphocytes Relative 05/30/2019 27  % Final  . Lymphs Abs 05/30/2019 2.0  0.7 - 4.0 K/uL Final  . Monocytes Relative 05/30/2019 7  % Final  . Monocytes Absolute 05/30/2019 0.5  0.1 - 1.0 K/uL Final  . Eosinophils Relative 05/30/2019 2  % Final  . Eosinophils Absolute 05/30/2019 0.1  0.0 - 0.5 K/uL Final  . Basophils Relative 05/30/2019 0  % Final  . Basophils Absolute 05/30/2019 0.0  0.0 - 0.1 K/uL Final  . Immature Granulocytes 05/30/2019 0  % Final  . Abs Immature Granulocytes 05/30/2019 0.03  0.00 - 0.07 K/uL Final   Performed at [facility], [address], [place], [place] [address]  . Retic Ct Pct 05/30/2019 1.7  0.4 - 3.1 % Final  . RBC. 05/30/2019  5.10  4.22 - 5.81 MIL/uL Final  . Retic Count, Absolute 05/30/2019 84.7  19.0 - 186.0 K/uL Final  . Immature Retic Fract 05/30/2019 13.6  2.3 - 15.9 % Final  . Reticulocyte Hemoglobin 05/30/2019 35.3  >27.9 pg Final   Comment:        Given the high negative predictive value of a RET-He result > 32 pg iron deficiency is essentially excluded. If this patient is anemic other etiologies should be considered. Performed at [facility], [address], [place], [place] [address]   . LDH 05/30/2019 115  98 - 192 U/L Final   Performed at [facility], [address], [place], [place] [address]  . Total Protein 05/30/2019 7.0  6.5 - 8.1 g/dL Final  . Albumin 05/30/2019 4.4  3.5 - 5.0 g/dL Final  . AST 05/30/2019 15  15 - 41 U/L Final  . ALT 05/30/2019 31  0 - 44 U/L Final  . Alkaline Phosphatase 05/30/2019 55  38 - 126 U/L Final  . Total Bilirubin 05/30/2019 1.7* 0.3 - 1.2 mg/dL Final  . Bilirubin, Direct 05/30/2019 0.3* 0.0 - 0.2 mg/dL Final  . Indirect Bilirubin 05/30/2019 1.4* 0.3 - 0.9 mg/dL Final   Performed at [facility], [address], [place], [place] [address]  Lab on 05/09/2019  Component Date Value Ref Range Status  . WBC  05/09/2019 6.2  4.0 - 10.5 K/uL Final  . RBC 05/09/2019 5.03  4.22 - 5.81 Mil/uL Final  . Platelets 05/09/2019 204.0  150.0 - 400.0 K/uL Final  . Hemoglobin 05/09/2019 15.5  13.0 - 17.0 g/dL Final  . HCT 05/09/2019 45.6  39.0 - 52.0 % Final  . MCV 05/09/2019 90.7  78.0 - 100.0 fl Final  . MCHC 05/09/2019 34.0  30.0 - 36.0 g/dL Final  . RDW 05/09/2019 13.2  11.5 - 15.5 % Final  . Retic Ct Pct 05/09/2019 1.9  % Final  . ABS Retic 05/09/2019 97,850* 25,000 - 9,000 cells/uL Final  . Haptoglobin 05/09/2019 45  43 - 212 mg/dL Final  Lab on 05/08/2019  Component Date Value Ref Range Status  . Total Bilirubin 05/08/2019 1.4* 0.2 - 1.2 mg/dL Final  . Bilirubin, Direct 05/08/2019 0.3  0.0 - 0.3 mg/dL Final  . Alkaline Phosphatase 05/08/2019 51  39 - 117 U/L Final   . AST 05/08/2019 12  0 - 37 U/L Final  . ALT 05/08/2019 18  0 - 53 U/L Final  . Total Protein 05/08/2019 6.7  6.0 - 8.3 g/dL Final  . Albumin 05/08/2019 4.7  3.5 - 5.2 g/dL Final  . Total Bilirubin 05/08/2019 1.4* 0.2 - 1.2 mg/dL Final  . Bilirubin, Direct 05/08/2019 0.3* 0.0 - 0.2 mg/dL Final  . Indirect Bilirubin 05/08/2019 1.1  0.2 - 1.2 mg/dL (calc) Final     ASSESSMENT/PLAN   Reticulocytosis 1. Reticulocytosis -Labs performed by Dr. Caryl Bis in November 2020 revealed a reticulocyte count of 97,850  Cells/uL, recheck percentage 1.9, 45, hemoglobin within normal at 15.5 and hematocrit within normal at 45.6.  -No history of recent major bleed.  No recent erythropoietin administration or vitamin B12.  No recent surgeries or major infections. -Elevated reticulocyte count is a common finding in hemolytic anemia.  Review of patient's lab do not suggest he has any hemolysis.  Recommend we complete lab studies on patient including repeat reticulocyte count, LDH, CBC, LFT panel.  We will also request pathology for review of blood smear to accurately count reticulocytes. -No intervention indicated at this time. -Patient will follow up in 2 weeks with virtual visit.  2.  Indirect hyperbilirubinemia: -Bilirubin noted to be elevated on routine blood draw with PCP at 1.4, AST and ALT within normal. -Reticulocytosis elevated bilirubin are most likely not associated. -Several of the patient's medications are known to cause elevation in bilirubin.  Patient reports starting Januvia earlier this year.  Ezetimibe was started in October. -If elevated bilirubin continues would recommend abdominal ultrasound to rule out hepatic steatosis.  Addendum: -I have independently elicited history and examined this patient.  I agree with the HPI and assessment and plan written above by my nurse practitioner Carver Fila, FNP.  He has a mild elevated total bilirubin, most of it is indirect bilirubin.  Other LFTs are  normal.  This is likely medication induced or from fatty liver.  Reticulocyte percent was normal.  We will check LDH and repeat reticulocyte count today to rule out hemolysis.  Hemolysis is less likely as his hemoglobin and haptoglobin was also normal.  Orders Placed This Encounter  Procedures  . CBC with Differential    Standing Status:   Future    Number of Occurrences:   1    Standing Expiration Date:   05/29/2020  . Pathologist smear review    Standing Status:   Future    Number of Occurrences:   1  Standing Expiration Date:   05/29/2020  . Retic Panel    Standing Status:   Future    Number of Occurrences:   1    Standing Expiration Date:   05/29/2020  . Lactate dehydrogenase    Standing Status:   Future    Number of Occurrences:   1    Standing Expiration Date:   05/29/2020  . Hepatic function panel    Standing Status:   Future    Number of Occurrences:   1    Standing Expiration Date:   05/29/2020    All questions were answered. The patient knows to call the clinic with any problems, questions or concerns.  This note was electronically signed.    Derek Jack, MD  05/30/2019 6:51 PM

## 2019-05-31 LAB — PATHOLOGIST SMEAR REVIEW

## 2019-06-13 ENCOUNTER — Encounter (HOSPITAL_COMMUNITY): Payer: Self-pay | Admitting: Hematology

## 2019-06-13 ENCOUNTER — Telehealth (HOSPITAL_BASED_OUTPATIENT_CLINIC_OR_DEPARTMENT_OTHER): Payer: Federal, State, Local not specified - PPO | Admitting: Hematology

## 2019-06-13 DIAGNOSIS — R701 Abnormal plasma viscosity: Secondary | ICD-10-CM

## 2019-06-13 NOTE — Progress Notes (Signed)
Virtual Visit via Video Note  I connected with Jermaine Ford on 06/13/19 at  3:35 PM EST by a video enabled telemedicine application and verified that I am speaking with the correct person using two identifiers.   I discussed the limitations of evaluation and management by telemedicine and the availability of in person appointments. The patient expressed understanding and agreed to proceed.  History of Present Illness: He was seen in our clinic for elevated absolute reticulocyte count, total and indirect hyperbilirubinemia.  Denies any prior history of hemolytic anemia.   Observations/Objective: He is feeling better.  Appetite and energy levels are 100%.  Denies any fevers, night sweats or weight loss.  Denies any nausea, vomiting or diarrhea constipation.  No abdominal pain reported.  Assessment and Plan:  1.  Reticulocytosis: -Repeated reticulocyte percent was normal.  Absolute reticulocyte count was also normal.  LDH was normal ruling out hemolysis. -No further testing necessary.  2.  Indirect hyperbilirubinemia: -he had elevated bilirubin since October of this year.  Bilirubin in December 2019 was normal. -He reports that ezetimibe was started in October of this year.  Januvia was started around June.  Both of these medications can increase bilirubin. -Other differential diagnosis also includes fatty liver.  I have recommended imaging of the liver by ultrasound. -He would like to wait and watch at this time.  We will see him back in 6 months with repeat blood work.   Follow Up Instructions: RTC 6 months with labs.   I discussed the assessment and treatment plan with the patient. The patient was provided an opportunity to ask questions and all were answered. The patient agreed with the plan and demonstrated an understanding of the instructions.   The patient was advised to call back or seek an in-person evaluation if the symptoms worsen or if the condition fails to improve as  anticipated.  I provided 11 minutes of non-face-to-face time during this encounter.   Derek Jack, MD

## 2019-06-17 ENCOUNTER — Encounter: Payer: Federal, State, Local not specified - PPO | Admitting: Internal Medicine

## 2019-06-25 DIAGNOSIS — E669 Obesity, unspecified: Secondary | ICD-10-CM | POA: Diagnosis not present

## 2019-06-25 DIAGNOSIS — E119 Type 2 diabetes mellitus without complications: Secondary | ICD-10-CM | POA: Diagnosis not present

## 2019-06-25 DIAGNOSIS — Z713 Dietary counseling and surveillance: Secondary | ICD-10-CM | POA: Diagnosis not present

## 2019-07-23 DIAGNOSIS — K080 Exfoliation of teeth due to systemic causes: Secondary | ICD-10-CM | POA: Diagnosis not present

## 2019-07-30 ENCOUNTER — Ambulatory Visit: Payer: Federal, State, Local not specified - PPO | Admitting: Family Medicine

## 2019-07-31 DIAGNOSIS — E669 Obesity, unspecified: Secondary | ICD-10-CM | POA: Diagnosis not present

## 2019-07-31 DIAGNOSIS — Z713 Dietary counseling and surveillance: Secondary | ICD-10-CM | POA: Diagnosis not present

## 2019-07-31 DIAGNOSIS — E119 Type 2 diabetes mellitus without complications: Secondary | ICD-10-CM | POA: Diagnosis not present

## 2019-08-14 ENCOUNTER — Other Ambulatory Visit: Payer: Self-pay | Admitting: Family Medicine

## 2019-08-22 ENCOUNTER — Other Ambulatory Visit: Payer: Self-pay | Admitting: Family Medicine

## 2019-08-29 DIAGNOSIS — Z23 Encounter for immunization: Secondary | ICD-10-CM | POA: Diagnosis not present

## 2019-09-17 ENCOUNTER — Other Ambulatory Visit: Payer: Self-pay | Admitting: Family Medicine

## 2019-09-26 DIAGNOSIS — Z23 Encounter for immunization: Secondary | ICD-10-CM | POA: Diagnosis not present

## 2019-10-02 ENCOUNTER — Other Ambulatory Visit: Payer: Self-pay | Admitting: Family Medicine

## 2019-10-14 DIAGNOSIS — Z713 Dietary counseling and surveillance: Secondary | ICD-10-CM | POA: Diagnosis not present

## 2019-10-14 DIAGNOSIS — E119 Type 2 diabetes mellitus without complications: Secondary | ICD-10-CM | POA: Diagnosis not present

## 2019-10-14 DIAGNOSIS — E669 Obesity, unspecified: Secondary | ICD-10-CM | POA: Diagnosis not present

## 2019-11-27 DIAGNOSIS — E669 Obesity, unspecified: Secondary | ICD-10-CM | POA: Diagnosis not present

## 2019-11-27 DIAGNOSIS — E119 Type 2 diabetes mellitus without complications: Secondary | ICD-10-CM | POA: Diagnosis not present

## 2019-11-27 DIAGNOSIS — Z713 Dietary counseling and surveillance: Secondary | ICD-10-CM | POA: Diagnosis not present

## 2019-12-19 ENCOUNTER — Ambulatory Visit (HOSPITAL_COMMUNITY): Payer: Federal, State, Local not specified - PPO | Admitting: Hematology

## 2019-12-19 ENCOUNTER — Other Ambulatory Visit (HOSPITAL_COMMUNITY): Payer: Federal, State, Local not specified - PPO

## 2020-01-06 DIAGNOSIS — E119 Type 2 diabetes mellitus without complications: Secondary | ICD-10-CM | POA: Diagnosis not present

## 2020-01-06 DIAGNOSIS — E782 Mixed hyperlipidemia: Secondary | ICD-10-CM | POA: Diagnosis not present

## 2020-01-06 DIAGNOSIS — E669 Obesity, unspecified: Secondary | ICD-10-CM | POA: Diagnosis not present

## 2020-01-28 ENCOUNTER — Other Ambulatory Visit: Payer: Self-pay | Admitting: Family Medicine

## 2020-02-04 DIAGNOSIS — M069 Rheumatoid arthritis, unspecified: Secondary | ICD-10-CM | POA: Diagnosis not present

## 2020-02-04 DIAGNOSIS — E119 Type 2 diabetes mellitus without complications: Secondary | ICD-10-CM | POA: Diagnosis not present

## 2020-02-04 DIAGNOSIS — H25012 Cortical age-related cataract, left eye: Secondary | ICD-10-CM | POA: Diagnosis not present

## 2020-02-04 DIAGNOSIS — M1990 Unspecified osteoarthritis, unspecified site: Secondary | ICD-10-CM | POA: Diagnosis not present

## 2020-02-04 DIAGNOSIS — H25042 Posterior subcapsular polar age-related cataract, left eye: Secondary | ICD-10-CM | POA: Diagnosis not present

## 2020-02-04 DIAGNOSIS — Z79899 Other long term (current) drug therapy: Secondary | ICD-10-CM | POA: Diagnosis not present

## 2020-02-04 DIAGNOSIS — H2512 Age-related nuclear cataract, left eye: Secondary | ICD-10-CM | POA: Diagnosis not present

## 2020-02-04 DIAGNOSIS — Z981 Arthrodesis status: Secondary | ICD-10-CM | POA: Diagnosis not present

## 2020-02-04 DIAGNOSIS — Z7984 Long term (current) use of oral hypoglycemic drugs: Secondary | ICD-10-CM | POA: Diagnosis not present

## 2020-02-04 DIAGNOSIS — M109 Gout, unspecified: Secondary | ICD-10-CM | POA: Diagnosis not present

## 2020-03-04 DIAGNOSIS — E119 Type 2 diabetes mellitus without complications: Secondary | ICD-10-CM | POA: Diagnosis not present

## 2020-03-04 DIAGNOSIS — E669 Obesity, unspecified: Secondary | ICD-10-CM | POA: Diagnosis not present

## 2020-03-04 DIAGNOSIS — Z713 Dietary counseling and surveillance: Secondary | ICD-10-CM | POA: Diagnosis not present

## 2020-03-10 DIAGNOSIS — E669 Obesity, unspecified: Secondary | ICD-10-CM | POA: Diagnosis not present

## 2020-03-10 DIAGNOSIS — H25041 Posterior subcapsular polar age-related cataract, right eye: Secondary | ICD-10-CM | POA: Diagnosis not present

## 2020-03-10 DIAGNOSIS — H25011 Cortical age-related cataract, right eye: Secondary | ICD-10-CM | POA: Diagnosis not present

## 2020-03-10 DIAGNOSIS — M1990 Unspecified osteoarthritis, unspecified site: Secondary | ICD-10-CM | POA: Diagnosis not present

## 2020-03-10 DIAGNOSIS — Z981 Arthrodesis status: Secondary | ICD-10-CM | POA: Diagnosis not present

## 2020-03-10 DIAGNOSIS — Z79899 Other long term (current) drug therapy: Secondary | ICD-10-CM | POA: Diagnosis not present

## 2020-03-10 DIAGNOSIS — E119 Type 2 diabetes mellitus without complications: Secondary | ICD-10-CM | POA: Diagnosis not present

## 2020-03-10 DIAGNOSIS — H2511 Age-related nuclear cataract, right eye: Secondary | ICD-10-CM | POA: Diagnosis not present

## 2020-03-10 DIAGNOSIS — Z7984 Long term (current) use of oral hypoglycemic drugs: Secondary | ICD-10-CM | POA: Diagnosis not present

## 2020-04-30 DIAGNOSIS — E119 Type 2 diabetes mellitus without complications: Secondary | ICD-10-CM | POA: Diagnosis not present

## 2020-04-30 DIAGNOSIS — Z713 Dietary counseling and surveillance: Secondary | ICD-10-CM | POA: Diagnosis not present

## 2020-04-30 DIAGNOSIS — E669 Obesity, unspecified: Secondary | ICD-10-CM | POA: Diagnosis not present

## 2020-05-15 ENCOUNTER — Telehealth: Payer: Self-pay | Admitting: Family Medicine

## 2020-05-15 ENCOUNTER — Ambulatory Visit: Payer: Federal, State, Local not specified - PPO | Admitting: Family Medicine

## 2020-05-15 ENCOUNTER — Other Ambulatory Visit: Payer: Self-pay

## 2020-05-15 ENCOUNTER — Ambulatory Visit (INDEPENDENT_AMBULATORY_CARE_PROVIDER_SITE_OTHER): Payer: Federal, State, Local not specified - PPO

## 2020-05-15 ENCOUNTER — Encounter: Payer: Self-pay | Admitting: Family Medicine

## 2020-05-15 VITALS — BP 118/80 | HR 68 | Temp 98.7°F | Ht 70.0 in | Wt 250.4 lb

## 2020-05-15 DIAGNOSIS — M5412 Radiculopathy, cervical region: Secondary | ICD-10-CM | POA: Diagnosis not present

## 2020-05-15 DIAGNOSIS — R1011 Right upper quadrant pain: Secondary | ICD-10-CM | POA: Insufficient documentation

## 2020-05-15 DIAGNOSIS — E119 Type 2 diabetes mellitus without complications: Secondary | ICD-10-CM | POA: Diagnosis not present

## 2020-05-15 DIAGNOSIS — G8929 Other chronic pain: Secondary | ICD-10-CM

## 2020-05-15 DIAGNOSIS — M549 Dorsalgia, unspecified: Secondary | ICD-10-CM | POA: Diagnosis not present

## 2020-05-15 DIAGNOSIS — Z981 Arthrodesis status: Secondary | ICD-10-CM | POA: Diagnosis not present

## 2020-05-15 DIAGNOSIS — M546 Pain in thoracic spine: Secondary | ICD-10-CM

## 2020-05-15 DIAGNOSIS — M5012 Mid-cervical disc disorder, unspecified level: Secondary | ICD-10-CM | POA: Diagnosis not present

## 2020-05-15 DIAGNOSIS — Z1211 Encounter for screening for malignant neoplasm of colon: Secondary | ICD-10-CM

## 2020-05-15 DIAGNOSIS — E785 Hyperlipidemia, unspecified: Secondary | ICD-10-CM | POA: Diagnosis not present

## 2020-05-15 DIAGNOSIS — M2578 Osteophyte, vertebrae: Secondary | ICD-10-CM | POA: Diagnosis not present

## 2020-05-15 DIAGNOSIS — M4322 Fusion of spine, cervical region: Secondary | ICD-10-CM | POA: Diagnosis not present

## 2020-05-15 LAB — POCT GLYCOSYLATED HEMOGLOBIN (HGB A1C): Hemoglobin A1C: 9.8 % — AB (ref 4.0–5.6)

## 2020-05-15 MED ORDER — OZEMPIC (0.25 OR 0.5 MG/DOSE) 2 MG/1.5ML ~~LOC~~ SOPN: PEN_INJECTOR | SUBCUTANEOUS | 1 refills | Status: DC

## 2020-05-15 NOTE — Addendum Note (Signed)
Addended by: Leone Haven on: 05/15/2020 04:05 PM   Modules accepted: Orders

## 2020-05-15 NOTE — Assessment & Plan Note (Signed)
Intermittent issue.  He is status post cholecystectomy so would seem less likely to be related to a biliary issue.  We will check labs.  Right upper quadrant ultrasound ordered.

## 2020-05-15 NOTE — Progress Notes (Addendum)
Tommi Rumps, MD Phone: [phone]  Jermaine Ford is a 54 y.o. male who presents today for follow-up.  Back pain: Notes this has been going on for a while now.  It is between his shoulder blades.  He notes his right shoulder goes numb when it occurs.  He notes it is excruciating at times.  He notes by the end of the day at work it would be difficult to tolerate.  Has improved some over the last 2 weeks.  No injury.  Right upper quadrant pain: This has been going on intermittently for some time now.  Notes it occurs right below the rib cage.  It feels like his prior gallbladder pain though he is status post cholecystectomy.  Occasionally occurs if he eats no other times he will have it randomly.  No nausea, vomiting, diarrhea, or blood in his stool.  Diabetes: Not checking any blood sugars.  He is taking Januvia, Jardiance, and Metformin though he has been out of them for a short period of time.  No polyuria or polydipsia.  No hypoglycemia.  He has seen ophthalmology in the last year.  Social History   Tobacco Use  Smoking Status Never Smoker  Smokeless Tobacco Never Used     ROS see history of present illness  Objective  Physical Exam Vitals:   05/15/20 1529  BP: 118/80  Pulse: 68  Temp: 98.7 F (37.1 C)  SpO2: 97%    BP Readings from Last 3 Encounters:  05/15/20 118/80  05/30/19 121/66  04/26/19 130/70   Wt Readings from Last 3 Encounters:  05/15/20 250 lb 6.4 oz (113.6 kg)  05/30/19 254 lb 14.4 oz (115.6 kg)  04/26/19 249 lb 9.6 oz (113.2 kg)    Physical Exam Constitutional:      General: He is not in acute distress.    Appearance: He is not diaphoretic.  Cardiovascular:     Rate and Rhythm: Normal rate and regular rhythm.     Heart sounds: Normal heart sounds.  Pulmonary:     Effort: Pulmonary effort is normal.     Breath sounds: Normal breath sounds.  Abdominal:     General: Bowel sounds are normal. There is no distension.     Palpations: Abdomen  is soft.     Tenderness: There is abdominal tenderness (Epigastric region). There is no guarding or rebound.  Musculoskeletal:     Right lower leg: No edema.     Left lower leg: No edema.     Comments: No midline spine tenderness, no midline spine step-off, no muscular back tenderness, negative Spurling's  Skin:    General: Skin is warm and dry.  Neurological:     Mental Status: He is alert.     Comments: 5/5 strength in bilateral biceps, triceps, grip, quads, hamstrings, plantar and dorsiflexion, sensation to light touch intact in bilateral UE and LE, normal gait, 2+ patellar, brachioradialis, and biceps reflexes      Assessment/Plan: Please see individual problem list.  Problem List Items Addressed This Visit    Cervical radiculopathy    Concern for cervical impingement given numbness to his shoulder with his symptoms though his pain is in the thoracic region.  Will obtain a cervical plain film and thoracic plain film.      Relevant Orders   DG Cervical Spine Complete (Completed)   Colon cancer screening    The patient is due for colonoscopy.  Referral placed.      Relevant Orders   Ambulatory  referral to Gastroenterology   Hyperlipidemia    Check lipid panel.  Continue Zetia 10 mg daily.      Relevant Orders   Lipid panel (Completed)   Comp Met (CMET) (Completed)   Right upper quadrant pain    Intermittent issue.  He is status post cholecystectomy so would seem less likely to be related to a biliary issue.  We will check labs.  Right upper quadrant ultrasound ordered.      Relevant Orders   US Abdomen Limited RUQ (LIVER/GB)   Lipase (Completed)   Thoracic back pain - Primary   Relevant Orders   DG Thoracic Spine W/Swimmers (Completed)   Type 2 diabetes mellitus without complication, without long-term current use of insulin (HCC)    A1c is uncontrolled.  We will transition him from [place] to [place].  He will ramp up on this from 0.25 mg once weekly for 4 weeks and  then go to 0.5 mg once weekly.  He will wait to hear from [place] regarding his lab work prior to starting on the [employer].  We will refill his medications.  He will continue Jardiance 25 mg daily and Metformin XR 2000 mg daily.  Check urine microalbumin.  The patient denies personal or family history of thyroid cancer, parathyroid cancer, and adrenal gland cancer.  Discussed medullary thyroid cancer seen in rats studies though has not significantly been seen in humans.  Advised to monitor for any changes in his anterior neck if we are to start on the Ozempic.      Relevant Medications   Semaglutide,0.25 or 0.5MG/DOS, (OZEMPIC, 0.25 OR 0.5 MG/DOSE,) 2 MG/1.5ML SOPN   Other Relevant Orders   POCT HgB A1C (Completed)   Urine Microalbumin w/creat. ratio (Completed)      This visit occurred during the SARS-CoV-2 public health emergency.  Safety protocols were in place, including screening questions prior to the visit, additional usage of staff PPE, and extensive cleaning of exam room while observing appropriate contact time as indicated for disinfecting solutions.    Tommi Rumps, MD [place]

## 2020-05-15 NOTE — Patient Instructions (Signed)
Nice to see you. We will get lab work today.  Please wait to start the Ozempic until after we get your lab results. We'll get an ultrasound of your abdomen as well. We'll get x-rays today as well.

## 2020-05-15 NOTE — Telephone Encounter (Signed)
lft vm for pt to call ofc to sch US 

## 2020-05-15 NOTE — Assessment & Plan Note (Signed)
The patient is due for colonoscopy.  Referral placed.

## 2020-05-15 NOTE — Assessment & Plan Note (Signed)
Concern for cervical impingement given numbness to his shoulder with his symptoms though his pain is in the thoracic region.  Will obtain a cervical plain film and thoracic plain film.

## 2020-05-15 NOTE — Assessment & Plan Note (Addendum)
A1c is uncontrolled.  We will transition him from [place] to [place].  He will ramp up on this from 0.25 mg once weekly for 4 weeks and then go to 0.5 mg once weekly.  He will wait to hear from [place] regarding his lab work prior to starting on the [employer].  We will refill his medications.  He will continue Jardiance 25 mg daily and Metformin XR 2000 mg daily.  Check urine microalbumin.  The patient denies personal or family history of thyroid cancer, parathyroid cancer, and adrenal gland cancer.  Discussed medullary thyroid cancer seen in rats studies though has not significantly been seen in humans.  Advised to monitor for any changes in his anterior neck if we are to start on the Ozempic.

## 2020-05-15 NOTE — Assessment & Plan Note (Signed)
Check lipid panel.  Continue Zetia 10 mg daily.

## 2020-05-16 LAB — COMPREHENSIVE METABOLIC PANEL
AG Ratio: 2 (calc) (ref 1.0–2.5)
ALT: 22 U/L (ref 9–46)
AST: 14 U/L (ref 10–35)
Albumin: 4.7 g/dL (ref 3.6–5.1)
Alkaline phosphatase (APISO): 72 U/L (ref 35–144)
BUN: 19 mg/dL (ref 7–25)
CO2: 27 mmol/L (ref 20–32)
Calcium: 9.4 mg/dL (ref 8.6–10.3)
Chloride: 102 mmol/L (ref 98–110)
Creat: 1.15 mg/dL (ref 0.70–1.33)
Globulin: 2.4 g/dL (calc) (ref 1.9–3.7)
Glucose, Bld: 259 mg/dL — ABNORMAL HIGH (ref 65–99)
Potassium: 4.1 mmol/L (ref 3.5–5.3)
Sodium: 139 mmol/L (ref 135–146)
Total Bilirubin: 1.4 mg/dL — ABNORMAL HIGH (ref 0.2–1.2)
Total Protein: 7.1 g/dL (ref 6.1–8.1)

## 2020-05-16 LAB — MICROALBUMIN / CREATININE URINE RATIO
Creatinine, Urine: 81 mg/dL (ref 20–320)
Microalb Creat Ratio: 4 mcg/mg creat (ref <30)
Microalb, Ur: 0.3 mg/dL

## 2020-05-16 LAB — LIPID PANEL
Cholesterol: 195 mg/dL (ref <200)
HDL: 37 mg/dL — ABNORMAL LOW (ref >=40)
LDL Cholesterol (Calc): 109 mg/dL (calc) — ABNORMAL HIGH
Non-HDL Cholesterol (Calc): 158 mg/dL (calc) — ABNORMAL HIGH (ref <130)
Total CHOL/HDL Ratio: 5.3 (calc) — ABNORMAL HIGH (ref <5.0)
Triglycerides: 369 mg/dL — ABNORMAL HIGH (ref <150)

## 2020-05-16 LAB — LIPASE: Lipase: 19 U/L (ref 7–60)

## 2020-05-18 ENCOUNTER — Encounter: Payer: Self-pay | Admitting: Family Medicine

## 2020-05-20 ENCOUNTER — Other Ambulatory Visit: Payer: Self-pay | Admitting: Family Medicine

## 2020-05-20 DIAGNOSIS — M5412 Radiculopathy, cervical region: Secondary | ICD-10-CM

## 2020-05-20 DIAGNOSIS — E785 Hyperlipidemia, unspecified: Secondary | ICD-10-CM

## 2020-05-20 MED ORDER — ROSUVASTATIN CALCIUM 40 MG PO TABS: 40.0000 mg | ORAL_TABLET | Freq: Every day | ORAL | 3 refills | Status: DC

## 2020-05-22 ENCOUNTER — Other Ambulatory Visit: Payer: Self-pay

## 2020-05-22 ENCOUNTER — Ambulatory Visit
Admission: RE | Admit: 2020-05-22 | Discharge: 2020-05-22 | Disposition: A | Discharge status: Home / Self Care | Payer: Federal, State, Local not specified - PPO | Source: Ambulatory Visit | Attending: Family Medicine | Admitting: Family Medicine

## 2020-05-22 DIAGNOSIS — R1011 Right upper quadrant pain: Secondary | ICD-10-CM | POA: Diagnosis not present

## 2020-05-22 DIAGNOSIS — K760 Fatty (change of) liver, not elsewhere classified: Secondary | ICD-10-CM | POA: Diagnosis not present

## 2020-05-25 ENCOUNTER — Telehealth (INDEPENDENT_AMBULATORY_CARE_PROVIDER_SITE_OTHER): Payer: Self-pay | Admitting: Gastroenterology

## 2020-05-25 ENCOUNTER — Telehealth: Payer: Self-pay | Admitting: Family Medicine

## 2020-05-25 ENCOUNTER — Other Ambulatory Visit: Payer: Self-pay

## 2020-05-25 DIAGNOSIS — Z86010 Personal history of colonic polyps: Secondary | ICD-10-CM

## 2020-05-25 MED ORDER — GOLYTELY 236 G PO SOLR: 4000.0000 mL | Freq: Once | ORAL | 0 refills | Status: AC

## 2020-05-25 NOTE — Progress Notes (Signed)
Gastroenterology Pre-Procedure Review  Request Date: Thursday 06/04/20 Requesting Physician: Dr. Vicente Males  PATIENT REVIEW QUESTIONS: The patient responded to the following health history questions as indicated:    1. Are you having any GI issues? no 2. Do you have a personal history of Polyps? yes (noted on 01/28/16 colonoscopy report performed by Dr. Hilarie Fredrickson) 3. Do you have a family history of Colon Cancer or Polyps? no 4. Diabetes Mellitus? yes (type 2) 5. Joint replacements in the past 12 months?no 6. Major health problems in the past 3 months?no 7. Any artificial heart valves, MVP, or defibrillator?no    MEDICATIONS & ALLERGIES:    Patient reports the following regarding taking any anticoagulation/antiplatelet therapy:   Plavix, Coumadin, Eliquis, Xarelto, Lovenox, Pradaxa, Brilinta, or Effient? no Aspirin? no  Patient confirms/reports the following medications:  Current Outpatient Medications  Medication Sig Dispense Refill  . acetaminophen (TYLENOL) 500 MG tablet Take 500 mg by mouth daily as needed for moderate pain or headache.    . allopurinol (ZYLOPRIM) 300 MG tablet TAKE 1 TABLET BY MOUTH DAILY 90 tablet 1  . blood glucose meter kit and supplies KIT Dispense based on patient and insurance preference.  Check glucose fasting daily (ICD 10 code E11.9). 1 each 0  . colchicine 0.6 MG tablet Take 1.2 mg (2 tablets) PO at the first sign of flare, followed in 1 hour with a single dose of 0.6 mg (1 tablet) PO, starting the next day take 0.6 mg (1 tablet) PO daily until gout flare has resolved 30 tablet 1  . ezetimibe (ZETIA) 10 MG tablet Take 1 tablet (10 mg total) by mouth daily. 90 tablet 3  . JARDIANCE 25 MG TABS tablet TAKE ONE TABLET EVERY DAY 30 tablet 1  . metFORMIN (GLUCOPHAGE-XR) 500 MG 24 hr tablet TAKE FOUR TABLETS BY MOUTH EVERY DAY WITH BREAKFAST 360 tablet 2  . OneTouch Delica Lancets 67H MISC     . ONETOUCH VERIO test strip     . rosuvastatin (CRESTOR) 40 MG tablet Take 1  tablet (40 mg total) by mouth daily. 90 tablet 3  . Semaglutide,0.25 or 0.5MG/DOS, (OZEMPIC, 0.25 OR 0.5 MG/DOSE,) 2 MG/1.5ML SOPN Inject 0.25 mg into the skin once a week for 28 days, THEN 0.5 mg once a week. 4.5 mL 1   Current Facility-Administered Medications  Medication Dose Route Frequency Provider Last Rate Last Admin  . 0.9 %  sodium chloride infusion  500 mL Intravenous Continuous Pyrtle, Lajuan Lines, MD        Patient confirms/reports the following allergies:  No Known Allergies  No orders of the defined types were placed in this encounter.   AUTHORIZATION INFORMATION Primary Insurance: 1D#: Group #:  Secondary Insurance: 1D#: Group #:  SCHEDULE INFORMATION: Date: Thursday 06/04/20 Time: Location:[facility]

## 2020-05-25 NOTE — Telephone Encounter (Signed)
Pt called returning your call to get lab results

## 2020-06-01 ENCOUNTER — Telehealth: Payer: Self-pay | Admitting: Family Medicine

## 2020-06-01 NOTE — Telephone Encounter (Signed)
Pt called returning a call about an [place]

## 2020-06-01 NOTE — Telephone Encounter (Signed)
Spoke with patient about [place] results

## 2020-06-02 ENCOUNTER — Other Ambulatory Visit
Admission: RE | Admit: 2020-06-02 | Discharge: 2020-06-02 | Disposition: A | Discharge status: Home / Self Care | Payer: Federal, State, Local not specified - PPO | Source: Ambulatory Visit | Attending: Gastroenterology | Admitting: Gastroenterology

## 2020-06-02 ENCOUNTER — Other Ambulatory Visit: Payer: Self-pay

## 2020-06-02 DIAGNOSIS — Z79899 Other long term (current) drug therapy: Secondary | ICD-10-CM | POA: Diagnosis not present

## 2020-06-02 DIAGNOSIS — Z7984 Long term (current) use of oral hypoglycemic drugs: Secondary | ICD-10-CM | POA: Diagnosis not present

## 2020-06-02 DIAGNOSIS — Z01812 Encounter for preprocedural laboratory examination: Secondary | ICD-10-CM | POA: Insufficient documentation

## 2020-06-02 DIAGNOSIS — Z20822 Contact with and (suspected) exposure to covid-19: Secondary | ICD-10-CM | POA: Insufficient documentation

## 2020-06-02 DIAGNOSIS — Z1211 Encounter for screening for malignant neoplasm of colon: Secondary | ICD-10-CM | POA: Diagnosis not present

## 2020-06-02 DIAGNOSIS — Z86010 Personal history of colonic polyps: Secondary | ICD-10-CM | POA: Diagnosis not present

## 2020-06-02 DIAGNOSIS — D123 Benign neoplasm of transverse colon: Secondary | ICD-10-CM | POA: Diagnosis not present

## 2020-06-02 DIAGNOSIS — D124 Benign neoplasm of descending colon: Secondary | ICD-10-CM | POA: Diagnosis not present

## 2020-06-02 LAB — SARS CORONAVIRUS 2 (TAT 6-24 HRS): SARS Coronavirus 2: NEGATIVE

## 2020-06-03 ENCOUNTER — Encounter: Payer: Self-pay | Admitting: Gastroenterology

## 2020-06-04 ENCOUNTER — Other Ambulatory Visit: Payer: Self-pay

## 2020-06-04 ENCOUNTER — Ambulatory Visit
Admission: RE | Admit: 2020-06-04 | Discharge: 2020-06-04 | Disposition: A | Discharge status: Home / Self Care | Payer: Federal, State, Local not specified - PPO | Attending: Gastroenterology | Admitting: Gastroenterology

## 2020-06-04 ENCOUNTER — Encounter
Admission: RE | Disposition: A | Discharge status: Home / Self Care | Payer: Self-pay | Source: Home / Self Care | Attending: Gastroenterology

## 2020-06-04 ENCOUNTER — Other Ambulatory Visit: Payer: Self-pay | Admitting: Family Medicine

## 2020-06-04 ENCOUNTER — Ambulatory Visit: Payer: Federal, State, Local not specified - PPO | Admitting: Certified Registered Nurse Anesthetist

## 2020-06-04 ENCOUNTER — Encounter: Payer: Self-pay | Admitting: Gastroenterology

## 2020-06-04 DIAGNOSIS — K760 Fatty (change of) liver, not elsewhere classified: Secondary | ICD-10-CM

## 2020-06-04 DIAGNOSIS — Z86010 Personal history of colonic polyps: Secondary | ICD-10-CM | POA: Diagnosis not present

## 2020-06-04 DIAGNOSIS — D123 Benign neoplasm of transverse colon: Secondary | ICD-10-CM | POA: Insufficient documentation

## 2020-06-04 DIAGNOSIS — K635 Polyp of colon: Secondary | ICD-10-CM

## 2020-06-04 DIAGNOSIS — Z1211 Encounter for screening for malignant neoplasm of colon: Secondary | ICD-10-CM | POA: Diagnosis not present

## 2020-06-04 DIAGNOSIS — Z20822 Contact with and (suspected) exposure to covid-19: Secondary | ICD-10-CM | POA: Insufficient documentation

## 2020-06-04 DIAGNOSIS — D124 Benign neoplasm of descending colon: Secondary | ICD-10-CM | POA: Diagnosis not present

## 2020-06-04 DIAGNOSIS — Z79899 Other long term (current) drug therapy: Secondary | ICD-10-CM | POA: Diagnosis not present

## 2020-06-04 DIAGNOSIS — Z7984 Long term (current) use of oral hypoglycemic drugs: Secondary | ICD-10-CM | POA: Insufficient documentation

## 2020-06-04 HISTORY — PX: COLONOSCOPY WITH PROPOFOL: SHX5780

## 2020-06-04 LAB — GLUCOSE, CAPILLARY: Glucose-Capillary: 148 mg/dL — ABNORMAL HIGH (ref 70–99)

## 2020-06-04 SURGERY — COLONOSCOPY WITH PROPOFOL
Anesthesia: General

## 2020-06-04 MED ORDER — PROPOFOL 500 MG/50ML IV EMUL
INTRAVENOUS | Status: DC | PRN
  Administered 2020-06-04: 150 ug/kg/min via INTRAVENOUS

## 2020-06-04 MED ORDER — PROPOFOL 10 MG/ML IV BOLUS
INTRAVENOUS | Status: DC | PRN
  Administered 2020-06-04: 40 mg via INTRAVENOUS

## 2020-06-04 MED ORDER — LIDOCAINE HCL (CARDIAC) PF 100 MG/5ML IV SOSY
PREFILLED_SYRINGE | INTRAVENOUS | Status: DC | PRN
  Administered 2020-06-04: 50 mg via INTRAVENOUS

## 2020-06-04 MED ORDER — PHENYLEPHRINE HCL (PRESSORS) 10 MG/ML IV SOLN
INTRAVENOUS | Status: DC | PRN
  Administered 2020-06-04: 100 ug via INTRAVENOUS

## 2020-06-04 MED ORDER — SODIUM CHLORIDE 0.9 % IV SOLN
INTRAVENOUS | Status: DC
  Administered 2020-06-04: 1000 mL via INTRAVENOUS

## 2020-06-04 NOTE — H&P (Signed)
Jonathon Bellows, MD [address] [place], [place], [place], [address] [address], [place], [place], [place], [address] Phone: [phone]  Fax: [phone]  Primary Care Physician:  Leone Haven, MD   Pre-Procedure History & Physical: HPI:  MANCIL PFENNING is a 54 y.o. male is here for an colonoscopy.   Past Medical History:  Diagnosis Date   Arthritis    Chicken pox    Diabetes mellitus without complication ([place])    Gout    Herniated cervical disc    C-5/C-6    C-6/C-7   Hyperlipidemia    Hyperlipidemia 0/32/1224   Umbilical hernia 02/19/36    Past Surgical History:  Procedure Laterality Date   BACK SURGERY  2012   herniated disk replaced./C5-C7   CHOLECYSTECTOMY  2002   COLONOSCOPY     UPPER GASTROINTESTINAL ENDOSCOPY     VENTRAL HERNIA REPAIR N/A 12/25/2017   Procedure: HERNIA REPAIR VENTRAL ADULT;  Surgeon: Robert Bellow, MD;  Location: [facility] ORS;  Service: General;  Laterality: N/A;    Prior to Admission medications   Medication Sig Start Date End Date Taking? Authorizing Provider  allopurinol (ZYLOPRIM) 300 MG tablet TAKE 1 TABLET BY MOUTH DAILY 01/29/20  Yes Leone Haven, MD  blood glucose meter kit and supplies KIT Dispense based on patient and insurance preference.  Check glucose fasting daily (ICD 10 code E11.9). 12/10/18  Yes Leone Haven, MD  colchicine 0.6 MG tablet Take 1.2 mg (2 tablets) PO at the first sign of flare, followed in 1 hour with a single dose of 0.6 mg (1 tablet) PO, starting the next day take 0.6 mg (1 tablet) PO daily until gout flare has resolved 04/26/19  Yes Leone Haven, MD  ezetimibe (ZETIA) 10 MG tablet Take 1 tablet (10 mg total) by mouth daily. 04/30/19  Yes Leone Haven, MD  metFORMIN (GLUCOPHAGE-XR) 500 MG 24 hr tablet TAKE FOUR TABLETS BY MOUTH EVERY DAY WITH BREAKFAST 04/17/19  Yes Leone Haven, MD  OneTouch Delica Lancets 04U [place]  03/15/19  Yes [provider]   ONETOUCH VERIO test strip  03/15/19  Yes [provider]  rosuvastatin (CRESTOR) 40 MG tablet Take 1 tablet (40 mg total) by mouth daily. 05/20/20  Yes Leone Haven, MD  Semaglutide,0.25 or 0.5MG/DOS, (OZEMPIC, 0.25 OR 0.5 MG/DOSE,) 2 MG/1.5ML SOPN Inject 0.25 mg into the skin once a week for 28 days, THEN 0.5 mg once a week. 05/15/20 08/11/20 Yes Leone Haven, MD  acetaminophen (TYLENOL) 500 MG tablet Take 500 mg by mouth daily as needed for moderate pain or headache. Patient not taking: Reported on 06/04/2020    [provider]  JARDIANCE 25 MG TABS tablet TAKE ONE TABLET EVERY DAY Patient not taking: Reported on 06/04/2020 10/03/19   Leone Haven, MD    Allergies as of 05/25/2020   (No Known Allergies)    Family History  Problem Relation Age of Onset   Breast cancer Mother    Diabetes Mother    Hypertension Mother    Brain cancer Mother    Arthritis Maternal Grandmother    Hypertension Maternal Grandmother    Diabetes Maternal Grandmother    Lung cancer Paternal Grandmother    Prostate cancer Paternal Uncle    Colon cancer Neg Hx    Esophageal cancer Neg Hx    Pancreatic cancer Neg Hx    Rectal cancer Neg Hx    Stomach cancer Neg Hx  Social History   Socioeconomic History   Marital status: Married    Spouse name: Not on file   Number of children: 2   Years of education: Not on file   Highest education level: Not on file  Occupational History    Employer: [employer]  Tobacco Use   Smoking status: Never Smoker   Smokeless tobacco: Never Used  Vaping Use   Vaping Use: Never used  Substance and Sexual Activity   Alcohol use: Yes    Alcohol/week: 0.0 - 1.0 standard drinks    Comment: rarely   Drug use: No   Sexual activity: Not on file  Other Topics Concern   Not on file  Social History Narrative   Not on file   Social Determinants of Health   Financial Resource Strain: Not on file  Food Insecurity: Not  on file  Transportation Needs: Not on file  Physical Activity: Not on file  Stress: Not on file  Social Connections: Not on file  Intimate Partner Violence: Not on file    Review of Systems: See HPI, otherwise negative ROS  Physical Exam: BP 133/83    Pulse 63    Temp (!) 97 F (36.1 C) (Temporal)    Resp 18    Ht _0  (1.778 m)    Wt 112.2 kg    SpO2 99%    BMI 35.48 kg/m  General:   Alert,  pleasant and cooperative in NAD Head:  Normocephalic and atraumatic. Neck:  Supple; no masses or thyromegaly. Lungs:  Clear throughout to auscultation, normal respiratory effort.    Heart:  +S1, +S2, Regular rate and rhythm, No edema. Abdomen:  Soft, nontender and nondistended. Normal bowel sounds, without guarding, and without rebound.   Neurologic:  Alert and  oriented x4;  grossly normal neurologically.  Impression/Plan: Benjaman Pott is here for an colonoscopy to be performed for surveillance due to prior history of colon polyps   Risks, benefits, limitations, and alternatives regarding  colonoscopy have been reviewed with the patient.  Questions have been answered.  All parties agreeable.   Jonathon Bellows, MD  06/04/2020, 9:00 AM

## 2020-06-04 NOTE — Anesthesia Preprocedure Evaluation (Signed)
Anesthesia Evaluation  Patient identified by MRN, date of birth, ID band Patient awake    Reviewed: Allergy & Precautions, H&P , NPO status , Patient's Chart, lab work & pertinent test results  History of Anesthesia Complications Negative for: history of anesthetic complications  Airway Mallampati: II  TM Distance: >3 FB     Dental  (+) Teeth Intact   Pulmonary neg pulmonary ROS, neg sleep apnea, neg COPD,    breath sounds clear to auscultation       Cardiovascular (-) angina(-) Past MI and (-) Cardiac Stents negative cardio ROS  (-) dysrhythmias  Rhythm:regular Rate:Normal     Neuro/Psych negative neurological ROS  negative psych ROS   GI/Hepatic negative GI ROS, Neg liver ROS,   Endo/Other  diabetes  Renal/GU negative Renal ROS  negative genitourinary   Musculoskeletal   Abdominal   Peds  Hematology negative hematology ROS (+)   Anesthesia Other Findings Past Medical History: No date: Arthritis No date: Chicken pox No date: Diabetes mellitus without complication (HCC) No date: Gout No date: Herniated cervical disc     Comment:  C-5/C-6    C-6/C-7 No date: Hyperlipidemia 01/11/2016: Hyperlipidemia 6/96/7893: Umbilical hernia  Past Surgical History: 2012: BACK SURGERY     Comment:  herniated disk replaced./C5-C7 2002: CHOLECYSTECTOMY No date: COLONOSCOPY No date: UPPER GASTROINTESTINAL ENDOSCOPY 12/25/2017: VENTRAL HERNIA REPAIR; N/A     Comment:  Procedure: HERNIA REPAIR VENTRAL ADULT;  Surgeon:               Robert Bellow, MD;  Location: [facility] ORS;  Service:               General;  Laterality: N/A;  BMI    Body Mass Index: 35.48 kg/m      Reproductive/Obstetrics negative OB ROS                             Anesthesia Physical Anesthesia Plan  ASA: II  Anesthesia Plan: General   Post-op Pain Management:    Induction:   PONV Risk Score and Plan: Propofol  infusion and TIVA  Airway Management Planned: Nasal Cannula  Additional Equipment:   Intra-op Plan:   Post-operative Plan:   Informed Consent: I have reviewed the patients History and Physical, chart, labs and discussed the procedure including the risks, benefits and alternatives for the proposed anesthesia with the patient or authorized representative who has indicated his/her understanding and acceptance.     Dental Advisory Given  Plan Discussed with: Anesthesiologist, CRNA and Surgeon  Anesthesia Plan Comments:         Anesthesia Quick Evaluation

## 2020-06-04 NOTE — Transfer of Care (Signed)
Immediate Anesthesia Transfer of Care Note  Patient: MATHEAU ORONA  Procedure(s) Performed: COLONOSCOPY WITH PROPOFOL (N/A )  Patient Location: PACU  Anesthesia Type:General  Level of Consciousness: awake, alert  and oriented  Airway & Oxygen Therapy: Patient Spontanous Breathing and Patient connected to nasal cannula oxygen  Post-op Assessment: Report given to RN and Post -op Vital signs reviewed and stable  Post vital signs: Reviewed and stable  Last Vitals:  Vitals Value Taken Time  BP    Temp    Pulse 64 06/04/20 0926  Resp 15 06/04/20 0926  SpO2 96 % 06/04/20 0926  Vitals shown include unvalidated device data.  Last Pain:  Vitals:   06/04/20 0825  TempSrc: Temporal  PainSc: 0-No pain         Complications: No complications documented.

## 2020-06-04 NOTE — Op Note (Signed)
[facility] Patient Name: Jermaine Ford Procedure Date: 06/04/2020 9:00 AM MRN: [id number] Account #: [id number] Date of Birth: 07/29/1965 Admit Type: Outpatient Age: 54 Room: [facility] ENDO ROOM 3 Gender: Male Note Status: Finalized Procedure:             Colonoscopy Indications:           Surveillance: Personal history of colonic polyps                         (unknown histology) on last colonoscopy more than 3                         years ago, Last colonoscopy: August 2017 Providers:             Jonathon Bellows MD, MD Referring MD:          Angela Adam. Caryl Bis (Referring MD) Medicines:             Monitored Anesthesia Care Complications:         No immediate complications. Procedure:             Pre-Anesthesia Assessment:                        - Prior to the procedure, a History and Physical was                         performed, and patient medications, allergies and                         sensitivities were reviewed. The patient's tolerance                         of previous anesthesia was reviewed.                        - The risks and benefits of the procedure and the                         sedation options and risks were discussed with the                         patient. All questions were answered and informed                         consent was obtained.                        - ASA Grade Assessment: II - A patient with mild                         systemic disease.                        After obtaining informed consent, the colonoscope was                         passed under direct vision. Throughout the procedure,                         the patient's blood pressure,  pulse, and oxygen                         saturations were monitored continuously. The                         Colonoscope was introduced through the anus and                         advanced to the the cecum, identified by the                         appendiceal orifice.  The colonoscopy was performed                         with ease. The patient tolerated the procedure well.                         The quality of the bowel preparation was excellent. Findings:      The perianal and digital rectal examinations were normal.      A 15 mm polyp was found in the transverse colon. The polyp was       pedunculated. The polyp was removed with a hot snare. Resection and       retrieval were complete. To prevent bleeding after the polypectomy, one       hemostatic clip was successfully placed. There was no bleeding at the       end of the procedure.      A 6 mm polyp was found in the proximal descending colon. The polyp was       sessile. The polyp was removed with a cold snare. Resection and       retrieval were complete.      The exam was otherwise without abnormality on direct and retroflexion       views. Impression:            - One 15 mm polyp in the transverse colon, removed                         with a hot snare. Resected and retrieved. Clip was                         placed.                        - One 6 mm polyp in the proximal descending colon,                         removed with a cold snare. Resected and retrieved.                        - The examination was otherwise normal on direct and                         retroflexion views. Recommendation:        - Discharge patient to home (with escort).                        - Resume previous diet.                        -  Continue present medications.                        - Await pathology results.                        - Repeat colonoscopy in 3 years for surveillance. Procedure Code(s):     --- Professional ---                        [phone], Colonoscopy, flexible; with removal of                         tumor(s), polyp(s), or other lesion(s) by snare                         technique Diagnosis Code(s):     --- Professional ---                        Z86.010, Personal history of colonic polyps                         K63.5, Polyp of colon CPT copyright 2019 American Medical Association. All rights reserved. The codes documented in this report are preliminary and upon coder review may  be revised to meet current compliance requirements. Jonathon Bellows, MD Jonathon Bellows MD, MD 06/04/2020 9:26:06 AM This report has been signed electronically. Number of Addenda: 0 Note Initiated On: 06/04/2020 9:00 AM Scope Withdrawal Time: 0 hours 15 minutes 59 seconds  Total Procedure Duration: 0 hours 17 minutes 54 seconds  Estimated Blood Loss:  Estimated blood loss: none.      [facility]

## 2020-06-05 ENCOUNTER — Encounter: Payer: Self-pay | Admitting: Gastroenterology

## 2020-06-05 LAB — SURGICAL PATHOLOGY

## 2020-06-05 NOTE — Anesthesia Postprocedure Evaluation (Signed)
Anesthesia Post Note  Patient: Jermaine Ford  Procedure(s) Performed: COLONOSCOPY WITH PROPOFOL (N/A )  Patient location during evaluation: PACU Anesthesia Type: General Level of consciousness: awake and alert Pain management: pain level controlled Vital Signs Assessment: post-procedure vital signs reviewed and stable Respiratory status: spontaneous breathing, nonlabored ventilation and respiratory function stable Cardiovascular status: blood pressure returned to baseline and stable Postop Assessment: no apparent nausea or vomiting Anesthetic complications: no   No complications documented.   Last Vitals:  Vitals:   06/04/20 0936 06/04/20 0956  BP:  117/81  Pulse:    Resp:    Temp:    SpO2: 98%     Last Pain:  Vitals:   06/05/20 0731  TempSrc:   PainSc: 0-No pain                 Tera Mater

## 2020-06-11 ENCOUNTER — Encounter: Payer: Self-pay | Admitting: Gastroenterology

## 2020-07-06 DIAGNOSIS — E669 Obesity, unspecified: Secondary | ICD-10-CM | POA: Diagnosis not present

## 2020-07-06 DIAGNOSIS — E119 Type 2 diabetes mellitus without complications: Secondary | ICD-10-CM | POA: Diagnosis not present

## 2020-07-06 DIAGNOSIS — Z713 Dietary counseling and surveillance: Secondary | ICD-10-CM | POA: Diagnosis not present

## 2020-07-07 ENCOUNTER — Other Ambulatory Visit (INDEPENDENT_AMBULATORY_CARE_PROVIDER_SITE_OTHER): Payer: Federal, State, Local not specified - PPO

## 2020-07-07 ENCOUNTER — Other Ambulatory Visit: Payer: Self-pay

## 2020-07-07 DIAGNOSIS — K760 Fatty (change of) liver, not elsewhere classified: Secondary | ICD-10-CM | POA: Diagnosis not present

## 2020-07-07 DIAGNOSIS — E785 Hyperlipidemia, unspecified: Secondary | ICD-10-CM

## 2020-07-08 LAB — HEPATIC FUNCTION PANEL
ALT: 21 U/L (ref 0–53)
AST: 12 U/L (ref 0–37)
Albumin: 4.5 g/dL (ref 3.5–5.2)
Alkaline Phosphatase: 52 U/L (ref 39–117)
Bilirubin, Direct: 0.4 mg/dL — ABNORMAL HIGH (ref 0.0–0.3)
Total Bilirubin: 2.1 mg/dL — ABNORMAL HIGH (ref 0.2–1.2)
Total Protein: 6.3 g/dL (ref 6.0–8.3)

## 2020-07-08 LAB — LIPID PANEL
Cholesterol: 86 mg/dL (ref 0–200)
HDL: 35 mg/dL — ABNORMAL LOW (ref >39.00)
LDL Cholesterol: 12 mg/dL (ref 0–99)
NonHDL: 50.7
Total CHOL/HDL Ratio: 2
Triglycerides: 192 mg/dL — ABNORMAL HIGH (ref 0.0–149.0)
VLDL: 38.4 mg/dL (ref 0.0–40.0)

## 2020-07-08 LAB — HEPATITIS B SURFACE ANTIBODY, QUANTITATIVE: Hep B S AB Quant (Post): <5 m[IU]/mL — ABNORMAL LOW (ref >=10)

## 2020-07-08 LAB — HEPATITIS A ANTIBODY, TOTAL: Hepatitis A AB,Total: NONREACTIVE

## 2020-07-15 ENCOUNTER — Ambulatory Visit (INDEPENDENT_AMBULATORY_CARE_PROVIDER_SITE_OTHER): Payer: Federal, State, Local not specified - PPO

## 2020-07-15 ENCOUNTER — Other Ambulatory Visit: Payer: Self-pay

## 2020-07-15 DIAGNOSIS — Z23 Encounter for immunization: Secondary | ICD-10-CM

## 2020-07-15 NOTE — Progress Notes (Signed)
Patient presented for Twinrix injection to left deltoid, patient voiced no concerns nor showed any signs of distress during injection. 

## 2020-08-18 DIAGNOSIS — E669 Obesity, unspecified: Secondary | ICD-10-CM | POA: Diagnosis not present

## 2020-08-18 DIAGNOSIS — Z713 Dietary counseling and surveillance: Secondary | ICD-10-CM | POA: Diagnosis not present

## 2020-08-18 DIAGNOSIS — E119 Type 2 diabetes mellitus without complications: Secondary | ICD-10-CM | POA: Diagnosis not present

## 2020-08-19 ENCOUNTER — Other Ambulatory Visit: Payer: Self-pay

## 2020-08-20 ENCOUNTER — Other Ambulatory Visit: Payer: Self-pay | Admitting: Family Medicine

## 2020-08-21 ENCOUNTER — Other Ambulatory Visit: Payer: Self-pay

## 2020-08-21 ENCOUNTER — Ambulatory Visit: Payer: Federal, State, Local not specified - PPO | Admitting: Family Medicine

## 2020-08-21 ENCOUNTER — Encounter: Payer: Self-pay | Admitting: Family Medicine

## 2020-08-21 VITALS — BP 120/78 | HR 71 | Temp 98.4°F | Ht 70.0 in | Wt 242.4 lb

## 2020-08-21 DIAGNOSIS — B351 Tinea unguium: Secondary | ICD-10-CM | POA: Diagnosis not present

## 2020-08-21 DIAGNOSIS — E119 Type 2 diabetes mellitus without complications: Secondary | ICD-10-CM

## 2020-08-21 DIAGNOSIS — E785 Hyperlipidemia, unspecified: Secondary | ICD-10-CM | POA: Diagnosis not present

## 2020-08-21 DIAGNOSIS — Z6834 Body mass index (BMI) 34.0-34.9, adult: Secondary | ICD-10-CM

## 2020-08-21 DIAGNOSIS — E6609 Other obesity due to excess calories: Secondary | ICD-10-CM

## 2020-08-21 DIAGNOSIS — Z23 Encounter for immunization: Secondary | ICD-10-CM | POA: Diagnosis not present

## 2020-08-21 LAB — POCT GLYCOSYLATED HEMOGLOBIN (HGB A1C): Hemoglobin A1C: 6.1 % — AB (ref 4.0–5.6)

## 2020-08-21 MED ORDER — EFINACONAZOLE 10 % EX SOLN: CUTANEOUS | 3 refills | Status: DC

## 2020-08-21 NOTE — Assessment & Plan Note (Signed)
Weight has trended down some.  Encouraged continued dietary changes and increasing exercise.

## 2020-08-21 NOTE — Assessment & Plan Note (Signed)
Continue Crestor 40 mg daily and Zetia 10 mg daily. 

## 2020-08-21 NOTE — Patient Instructions (Addendum)
Nice to see you. Please let us know if the topical treatment for your toenails is too expensive. Your A1c is very well controlled.  Please continue with your current diabetic regimen.

## 2020-08-21 NOTE — Progress Notes (Signed)
Tommi Rumps, MD Phone: [phone]  Jermaine Ford is a 55 y.o. male who presents today for f/u.  DIABETES Disease Monitoring: Blood Sugar ranges-140-150 Polyuria/phagia/dipsia- no      Medications: Compliance- taking metformin, ozempic 0.5 mg once weekly Hypoglycemic symptoms- no  HYPERLIPIDEMIA Symptoms Chest pain on exertion:  no Medications: Compliance- taking crestor and zetia Right upper quadrant pain- no  Muscle aches- no  Obesity: Patient has been working with a Microbiologist.  He is eating more greens and poultry and fish.  Occasionally eats beef.  He is avoiding fried foods.  He started a boxing fitness class and is doing this once weekly though he is building up to 2-3 times a week.  Neuropathy: Patient notes over the last month or so he has noted 1-2 episodes of foot tingling in the bottoms of his feet bilaterally.  No numbness.  No burning.  Does not occur each day and does note he will go weeks without having happened.     Social History   Tobacco Use  Smoking Status Never Smoker  Smokeless Tobacco Never Used    Current Outpatient Medications on File Prior to Visit  Medication Sig Dispense Refill  . acetaminophen (TYLENOL) 500 MG tablet Take 500 mg by mouth daily as needed for moderate pain or headache.    . allopurinol (ZYLOPRIM) 300 MG tablet TAKE ONE TABLET BY MOUTH DAILY 90 tablet 1  . blood glucose meter kit and supplies KIT Dispense based on patient and insurance preference.  Check glucose fasting daily (ICD 10 code E11.9). 1 each 0  . colchicine 0.6 MG tablet Take 1.2 mg (2 tablets) PO at the first sign of flare, followed in 1 hour with a single dose of 0.6 mg (1 tablet) PO, starting the next day take 0.6 mg (1 tablet) PO daily until gout flare has resolved 30 tablet 1  . ezetimibe (ZETIA) 10 MG tablet Take 1 tablet (10 mg total) by mouth daily. 90 tablet 3  . metFORMIN (GLUCOPHAGE-XR) 500 MG 24 hr tablet TAKE FOUR TABLETS EVERY DAY WITH BREAKFAST 360  tablet 2  . OneTouch Delica Lancets 10Y MISC     . ONETOUCH VERIO test strip     . rosuvastatin (CRESTOR) 40 MG tablet Take 1 tablet (40 mg total) by mouth daily. 90 tablet 3   Current Facility-Administered Medications on File Prior to Visit  Medication Dose Route Frequency Provider Last Rate Last Admin  . 0.9 %  sodium chloride infusion  500 mL Intravenous Continuous Pyrtle, Lajuan Lines, MD         ROS see history of present illness  Objective  Physical Exam Vitals:   08/21/20 1445  BP: 120/78  Pulse: 71  Temp: 98.4 F (36.9 C)  SpO2: 98%    BP Readings from Last 3 Encounters:  08/21/20 120/78  06/04/20 117/81  05/15/20 118/80   Wt Readings from Last 3 Encounters:  08/21/20 242 lb 6.4 oz (110 kg)  06/04/20 247 lb 4.6 oz (112.2 kg)  05/15/20 250 lb 6.4 oz (113.6 kg)    Physical Exam Constitutional:      General: He is not in acute distress.    Appearance: He is not diaphoretic.  Cardiovascular:     Rate and Rhythm: Normal rate and regular rhythm.     Heart sounds: Normal heart sounds.  Pulmonary:     Effort: Pulmonary effort is normal.     Breath sounds: Normal breath sounds.  Musculoskeletal:  General: No edema.     Right lower leg: No edema.     Left lower leg: No edema.  Skin:    General: Skin is warm and dry.  Neurological:     Mental Status: He is alert.    Diabetic Foot Exam - Simple   Simple Foot Form Diabetic Foot exam was performed with the following findings: Yes 08/21/2020  3:06 PM  Visual Inspection See comments: Yes Sensation Testing Intact to touch and monofilament testing bilaterally: Yes Pulse Check Posterior Tibialis and Dorsalis pulse intact bilaterally: Yes Comments Onychomycosis bilateral toes, callus along the tibial aspect of the right great toe, otherwise no deformities, ulcerations, or skin breakdown      Assessment/Plan: Please see individual problem list.  Problem List Items Addressed This Visit    Hyperlipidemia -  Primary    Continue Crestor 40 mg daily and Zetia 10 mg daily.      Obesity    Weight has trended down some.  Encouraged continued dietary changes and increasing exercise.      Onychomycosis    We will trial topical treatment as outlined below.  Discussed it could take up to a year for this to be beneficial.  We did discuss the potential for Lamisil and discussed the risk of hepatic function issues.  He deferred this type of treatment at this time.      Relevant Medications   Efinaconazole 10 % SOLN   Type 2 diabetes mellitus without complication, without long-term current use of insulin (HCC)   Relevant Orders   POCT HgB A1C    Other Visit Diagnoses    Need for hepatitis A and B vaccination       Relevant Orders   Hepatitis A hepatitis B combined vaccine IM (Completed)     This visit occurred during the SARS-CoV-2 public health emergency.  Safety protocols were in place, including screening questions prior to the visit, additional usage of staff PPE, and extensive cleaning of exam room while observing appropriate contact time as indicated for disinfecting solutions.    Tommi Rumps, MD [place]

## 2020-08-21 NOTE — Assessment & Plan Note (Signed)
We will trial topical treatment as outlined below.  Discussed it could take up to a year for this to be beneficial.  We did discuss the potential for Lamisil and discussed the risk of hepatic function issues.  He deferred this type of treatment at this time.

## 2020-09-17 ENCOUNTER — Other Ambulatory Visit: Payer: Self-pay | Admitting: Family Medicine

## 2020-09-29 DIAGNOSIS — Z713 Dietary counseling and surveillance: Secondary | ICD-10-CM | POA: Diagnosis not present

## 2020-09-29 DIAGNOSIS — E669 Obesity, unspecified: Secondary | ICD-10-CM | POA: Diagnosis not present

## 2020-09-29 DIAGNOSIS — E119 Type 2 diabetes mellitus without complications: Secondary | ICD-10-CM | POA: Diagnosis not present

## 2020-10-14 ENCOUNTER — Other Ambulatory Visit: Payer: Self-pay | Admitting: Family Medicine

## 2020-10-14 DIAGNOSIS — E119 Type 2 diabetes mellitus without complications: Secondary | ICD-10-CM

## 2020-10-19 ENCOUNTER — Telehealth: Payer: Self-pay

## 2020-10-19 ENCOUNTER — Other Ambulatory Visit: Payer: Self-pay

## 2020-11-16 DIAGNOSIS — E669 Obesity, unspecified: Secondary | ICD-10-CM | POA: Diagnosis not present

## 2020-11-16 DIAGNOSIS — E119 Type 2 diabetes mellitus without complications: Secondary | ICD-10-CM | POA: Diagnosis not present

## 2020-11-16 DIAGNOSIS — Z713 Dietary counseling and surveillance: Secondary | ICD-10-CM | POA: Diagnosis not present

## 2020-11-17 NOTE — Telephone Encounter (Signed)
Error. ng 

## 2020-12-14 ENCOUNTER — Other Ambulatory Visit: Payer: Self-pay | Admitting: Family Medicine

## 2021-01-08 ENCOUNTER — Other Ambulatory Visit: Payer: Self-pay

## 2021-01-08 ENCOUNTER — Ambulatory Visit: Payer: Federal, State, Local not specified - PPO | Admitting: Family Medicine

## 2021-01-08 VITALS — BP 120/70 | HR 71 | Temp 98.3°F | Ht 70.0 in | Wt 242.8 lb

## 2021-01-08 DIAGNOSIS — K219 Gastro-esophageal reflux disease without esophagitis: Secondary | ICD-10-CM | POA: Diagnosis not present

## 2021-01-08 DIAGNOSIS — M1A00X0 Idiopathic chronic gout, unspecified site, without tophus (tophi): Secondary | ICD-10-CM

## 2021-01-08 DIAGNOSIS — Z23 Encounter for immunization: Secondary | ICD-10-CM | POA: Diagnosis not present

## 2021-01-08 DIAGNOSIS — R1310 Dysphagia, unspecified: Secondary | ICD-10-CM

## 2021-01-08 DIAGNOSIS — E785 Hyperlipidemia, unspecified: Secondary | ICD-10-CM | POA: Diagnosis not present

## 2021-01-08 DIAGNOSIS — E119 Type 2 diabetes mellitus without complications: Secondary | ICD-10-CM | POA: Diagnosis not present

## 2021-01-08 LAB — CBC WITH DIFFERENTIAL/PLATELET
Basophils Absolute: 0 10*3/uL (ref 0.0–0.1)
Basophils Relative: 0.5 % (ref 0.0–3.0)
Eosinophils Absolute: 0.1 10*3/uL (ref 0.0–0.7)
Eosinophils Relative: 1.7 % (ref 0.0–5.0)
HCT: 45.7 % (ref 39.0–52.0)
Hemoglobin: 15.5 g/dL (ref 13.0–17.0)
Lymphocytes Relative: 20.7 % (ref 12.0–46.0)
Lymphs Abs: 1.4 10*3/uL (ref 0.7–4.0)
MCHC: 34 g/dL (ref 30.0–36.0)
MCV: 91.1 fl (ref 78.0–100.0)
Monocytes Absolute: 0.5 10*3/uL (ref 0.1–1.0)
Monocytes Relative: 6.7 % (ref 3.0–12.0)
Neutro Abs: 4.8 10*3/uL (ref 1.4–7.7)
Neutrophils Relative %: 70.4 % (ref 43.0–77.0)
Platelets: 204 10*3/uL (ref 150.0–400.0)
RBC: 5.02 Mil/uL (ref 4.22–5.81)
RDW: 13.7 % (ref 11.5–15.5)
WBC: 6.9 10*3/uL (ref 4.0–10.5)

## 2021-01-08 LAB — URIC ACID: Uric Acid, Serum: 6.1 mg/dL (ref 4.0–7.8)

## 2021-01-08 LAB — HEMOGLOBIN A1C: Hgb A1c MFr Bld: 6.7 % — ABNORMAL HIGH (ref 4.6–6.5)

## 2021-01-08 MED ORDER — OMEPRAZOLE 20 MG PO CPDR: 20.0000 mg | DELAYED_RELEASE_CAPSULE | Freq: Every day | ORAL | 3 refills | Status: DC

## 2021-01-08 MED ORDER — COLCHICINE 0.6 MG PO TABS: ORAL_TABLET | ORAL | 1 refills | Status: AC

## 2021-01-08 MED ORDER — ROSUVASTATIN CALCIUM 40 MG PO TABS: 40.0000 mg | ORAL_TABLET | Freq: Every day | ORAL | 3 refills | Status: DC

## 2021-01-08 NOTE — Progress Notes (Signed)
Tommi Rumps, MD Phone: [phone]  Jermaine Ford is a 55 y.o. male who presents today for f/u.  DIABETES Disease Monitoring: Blood Sugar ranges-130-155 Polyuria/phagia/dipsia- no      Optho- due Medications: Compliance- taking metformin, ozempic Hypoglycemic symptoms- no  HYPERLIPIDEMIA Symptoms Chest pain on exertion:  no   Leg claudication:   no Medications: Compliance- taking crestor and zetia Right upper quadrant pain- no  Muscle aches- no  GERD:   Reflux symptoms: yes, notes some burning intermittently over the past few months   Abd pain: no   Blood in stool: no  Dysphagia: yes, feels things get stuck briefly in his throat/upper esophagus   EGD: no history  Medication: none No family history of throat, esophagus, or gastric cancer  Gout: one mild flare 3-4 months ago. Otherwise he has been doing well. Takes allopurinol daily. Out of colchicine.    Social History   Tobacco Use  Smoking Status Never  Smokeless Tobacco Never    Current Outpatient Medications on File Prior to Visit  Medication Sig Dispense Refill   allopurinol (ZYLOPRIM) 300 MG tablet TAKE ONE TABLET BY MOUTH DAILY 90 tablet 1   blood glucose meter kit and supplies KIT Dispense based on patient and insurance preference.  Check glucose fasting daily (ICD 10 code E11.9). 1 each 0   ezetimibe (ZETIA) 10 MG tablet Take 1 tablet (10 mg total) by mouth daily. 90 tablet 3   GAVILYTE-G 236 g solution SMARTSIG:Milliliter(s) By Mouth     metFORMIN (GLUCOPHAGE-XR) 500 MG 24 hr tablet TAKE FOUR TABLETS EVERY DAY WITH BREAKFAST 360 tablet 2   OneTouch Delica Lancets 14H MISC      ONETOUCH VERIO test strip      OZEMPIC, 0.25 OR 0.5 MG/DOSE, 2 MG/1.5ML SOPN INJECT 0.25 MG INTO SKIN ONCE A WEEK FOR28 DAYS, THEN 0.5 MG ONCE A WEEK. 4.5 mL 1   Current Facility-Administered Medications on File Prior to Visit  Medication Dose Route Frequency Provider Last Rate Last Admin   0.9 %  sodium chloride infusion  500  mL Intravenous Continuous Pyrtle, Lajuan Lines, MD         ROS see history of present illness  Objective  Physical Exam Vitals:   01/08/21 0952  BP: 120/70  Pulse: 71  Temp: 98.3 F (36.8 C)  SpO2: 98%    BP Readings from Last 3 Encounters:  01/08/21 120/70  08/21/20 120/78  06/04/20 117/81   Wt Readings from Last 3 Encounters:  01/08/21 242 lb 12.8 oz (110.1 kg)  08/21/20 242 lb 6.4 oz (110 kg)  06/04/20 247 lb 4.6 oz (112.2 kg)    Physical Exam Constitutional:      General: He is not in acute distress.    Appearance: He is not diaphoretic.  Cardiovascular:     Rate and Rhythm: Normal rate and regular rhythm.     Heart sounds: Normal heart sounds.  Pulmonary:     Effort: Pulmonary effort is normal.     Breath sounds: Normal breath sounds.  Abdominal:     General: Bowel sounds are normal. There is no distension.     Palpations: Abdomen is soft.     Tenderness: no abdominal tenderness There is no guarding or rebound.  Musculoskeletal:     Right lower leg: No edema.     Left lower leg: No edema.  Skin:    General: Skin is warm and dry.  Neurological:     Mental Status: He is alert.  Assessment/Plan: Please see individual problem list.  Problem List Items Addressed This Visit     GERD (gastroesophageal reflux disease)    Patient has reflux and some dysphagia recently.  We will start on omeprazole 20 mg once daily.  We will refer to GI for further evaluation.  I advised if he is not able to get food or liquid to go down or if he is not able to regurgitated if he gets stuck he needs to be evaluated right away.       Relevant Medications   omeprazole (PRILOSEC) 20 MG capsule   Other Relevant Orders   Ambulatory referral to Gastroenterology   Hyperlipidemia    Continue Crestor 40 mg daily and Zetia 10 mg daily.       Relevant Medications   rosuvastatin (CRESTOR) 40 MG tablet   Primary gout    Check uric acid and CBC with differential.  He will continue  allopurinol 300 mg once daily.  We will refill his colchicine for as needed use.  I did counsel on the risk of myalgias with taking colchicine and a statin together.       Relevant Medications   colchicine 0.6 MG tablet   Other Relevant Orders   Uric acid   CBC w/Diff   Type 2 diabetes mellitus without complication, without long-term current use of insulin (HCC) - Primary    Generally well controlled.  We will check an A1c.  He will continue metformin XR 2000 mg daily and Ozempic 0.5 mg once weekly.       Relevant Medications   rosuvastatin (CRESTOR) 40 MG tablet   Other Relevant Orders   HgB A1c   Other Visit Diagnoses     Dysphagia, unspecified type       Relevant Orders   Ambulatory referral to Gastroenterology   Need for hepatitis A and B vaccination       Relevant Orders   Hepatitis A hepatitis B combined vaccine IM (Completed)        Health Maintenance: Encouraged to see his eye doctor on a yearly basis.  Return in about 3 months (around 04/10/2021) for Diabetes, GERD.  This visit occurred during the SARS-CoV-2 public health emergency.  Safety protocols were in place, including screening questions prior to the visit, additional usage of staff PPE, and extensive cleaning of exam room while observing appropriate contact time as indicated for disinfecting solutions.    Eric Sonnenberg, MD [facility] [place]  

## 2021-01-08 NOTE — Assessment & Plan Note (Signed)
Check uric acid and CBC with differential.  He will continue allopurinol 300 mg once daily.  We will refill his colchicine for as needed use.  I did counsel on the risk of myalgias with taking colchicine and a statin together.

## 2021-01-08 NOTE — Patient Instructions (Addendum)
Nice to see you. We will get you to see GI.  If you do not hear from them in the next week or 2 please let us know. We will let you know what your labs reveal. We will start you on omeprazole to see if that helps with your reflux and your trouble swallowing.  Even if this does help you do need to see GI.  If the omeprazole is not beneficial for the reflux please let me know. If anything ever gets stuck when swallowing and will not come back out or does not go down you need to be evaluated immediately. Please make sure you see your eye doctor on a yearly basis.

## 2021-01-08 NOTE — Assessment & Plan Note (Signed)
Generally well controlled.  We will check an A1c.  He will continue metformin XR 2000 mg daily and Ozempic 0.5 mg once weekly.

## 2021-01-08 NOTE — Assessment & Plan Note (Signed)
Continue Crestor 40 mg daily and Zetia 10 mg daily. 

## 2021-01-08 NOTE — Assessment & Plan Note (Signed)
Patient has reflux and some dysphagia recently.  We will start on omeprazole 20 mg once daily.  We will refer to GI for further evaluation.  I advised if he is not able to get food or liquid to go down or if he is not able to regurgitated if he gets stuck he needs to be evaluated right away.

## 2021-01-18 ENCOUNTER — Ambulatory Visit: Payer: Federal, State, Local not specified - PPO | Admitting: Family Medicine

## 2021-01-25 DIAGNOSIS — E669 Obesity, unspecified: Secondary | ICD-10-CM | POA: Diagnosis not present

## 2021-01-25 DIAGNOSIS — E119 Type 2 diabetes mellitus without complications: Secondary | ICD-10-CM | POA: Diagnosis not present

## 2021-01-25 DIAGNOSIS — Z713 Dietary counseling and surveillance: Secondary | ICD-10-CM | POA: Diagnosis not present

## 2021-03-18 ENCOUNTER — Ambulatory Visit
Admission: RE | Admit: 2021-03-18 | Discharge: 2021-03-18 | Disposition: A | Discharge status: Home / Self Care | Payer: Federal, State, Local not specified - PPO | Source: Ambulatory Visit | Attending: Emergency Medicine | Admitting: Emergency Medicine

## 2021-03-18 ENCOUNTER — Ambulatory Visit (INDEPENDENT_AMBULATORY_CARE_PROVIDER_SITE_OTHER): Payer: Federal, State, Local not specified - PPO

## 2021-03-18 ENCOUNTER — Ambulatory Visit: Payer: Federal, State, Local not specified - PPO | Admitting: Gastroenterology

## 2021-03-18 ENCOUNTER — Other Ambulatory Visit: Payer: Self-pay

## 2021-03-18 VITALS — BP 138/76 | HR 69 | Temp 98.1°F | Resp 16

## 2021-03-18 DIAGNOSIS — S92425A Nondisplaced fracture of distal phalanx of left great toe, initial encounter for closed fracture: Secondary | ICD-10-CM | POA: Diagnosis not present

## 2021-03-18 DIAGNOSIS — M79675 Pain in left toe(s): Secondary | ICD-10-CM | POA: Diagnosis not present

## 2021-03-18 DIAGNOSIS — S90932A Unspecified superficial injury of left great toe, initial encounter: Secondary | ICD-10-CM | POA: Diagnosis not present

## 2021-03-18 NOTE — ED Triage Notes (Addendum)
Patient presents to Urgent Care with complaints of left big toe pain since yesterday. Pt states he hit his toe against his dresser. Not treating pain. Pain increases with movement. He is concerned with possible fracture.

## 2021-03-18 NOTE — ED Provider Notes (Addendum)
Chief Complaint   Chief Complaint  Patient presents with   Foot Pain    Left big toe      Subjective, HPI  Jermaine Ford is a 55 y.o. male who presents with left great toe pain that started yesterday.  Patient reports hitting the toe against a dresser.  He has not been using any over-the-counter medication such as Tylenol or ibuprofen.  He reports pain that increases with movement and is concerned for fracture.  No additional injuries.  History obtained from patient.  Patient's problem list, past medical and social history, medications, and allergies were reviewed by me and updated in Epic.    ROS  See HPI.  Objective   Vitals:   03/18/21 1655  BP: 138/76  Pulse: 69  Resp: 16  Temp: 98.1 F (36.7 C)  SpO2: 95%    Vital signs and nursing note reviewed.   General: Appears well-developed and well-nourished. No acute distress.  Head: Normocephalic and atraumatic.   Neck: Normal range of motion, neck is supple.  Cardiovascular: Normal rate. Pulm/Chest: No respiratory distress.  Musculoskeletal: Left foot: Moderate TTP to left great toe distally.  There is bruising and swelling noted about the distal left great toe.  5/5 strength, full sensation, 2+  pulses, < 2 sec cap refill.  Neurological: Alert and oriented to person, place, and time.  Skin: Skin is warm and dry.   Psychiatric: Normal mood, affect, behavior, and thought content.    Data  No results found for any visits on 03/18/21.   Imaging Left great toe: On my read, nondisplaced fracture to distal phalanx of left great toe. Pending final interpretation.   Assessment & Plan  1. Closed nondisplaced fracture of distal phalanx of left great toe, initial encounter - Apply Post op shoe; Standing - Apply Post op shoe  55 y.o. male presents with left great toe pain that started yesterday.  Patient reports hitting the toe against a dresser.  He has not been using any over-the-counter medication such as Tylenol or  ibuprofen.  He reports pain that increases with movement and is concerned for fracture.  No additional injuries. Left great toe: On my read, nondisplaced fracture to distal phalanx of left great toe. Pending final interpretation.  Patient placed in postop shoe today.  Advised of at home treatment and care to include rest, ice, elevation.  Advised to follow-up with [facility] within about 7 to 10 days if symptoms worsen.  Patient verbalized understanding and agreed with plan.  Patient stable upon discharge.  Plan:   Discharge Instructions      Follow-up with primary care provider to inform of visit and treatment in clinic today.  Rest: Sometimes rest is all that is needed to treat a traumatic fracture of the toe. Ice: You may apply ice to the affected area 2-3 times daily for 10 to 15 minutes at a time. Elevate: Elevating your affected foot will help with swelling and discomfort. Medications: You may take Tylenol or ibuprofen as needed for pain as long as neither medication is contraindicated with any other current health conditions. Rigid or stiff-soled shoe. Wearing a stiff-soled shoe protects the toe and helps keep it properly positioned. Use of a postoperative shoe or bootwalker is also helpful. Buddy taping the fractured toe to another toe is appropriate. Surgery. If the break is badly displaced or if the joint is affected, surgery may be necessary. Surgery often involves the use of fixation devices, such as pins.  If symptoms  do not improve within 7 to 10 days or if symptoms worsen you may need follow-up from your primary care provider or orthopedics for further evaluation.         Serafina Royals, [place] 03/18/21 [place], [place], [place] 03/18/21 1756

## 2021-03-18 NOTE — Discharge Instructions (Signed)
Follow-up with primary care provider to inform of visit and treatment in clinic today.  Rest: Sometimes rest is all that is needed to treat a traumatic fracture of the toe. Ice: You may apply ice to the affected area 2-3 times daily for 10 to 15 minutes at a time. Elevate: Elevating your affected foot will help with swelling and discomfort. Medications: You may take Tylenol or ibuprofen as needed for pain as long as neither medication is contraindicated with any other current health conditions. Rigid or stiff-soled shoe. Wearing a stiff-soled shoe protects the toe and helps keep it properly positioned. Use of a postoperative shoe or bootwalker is also helpful. Buddy taping the fractured toe to another toe is appropriate. Surgery. If the break is badly displaced or if the joint is affected, surgery may be necessary. Surgery often involves the use of fixation devices, such as pins.  If symptoms do not improve within 7 to 10 days or if symptoms worsen you may need follow-up from your primary care provider or orthopedics for further evaluation.

## 2021-04-07 ENCOUNTER — Other Ambulatory Visit: Payer: Self-pay | Admitting: Family Medicine

## 2021-04-12 ENCOUNTER — Ambulatory Visit: Payer: Federal, State, Local not specified - PPO | Admitting: Family Medicine

## 2021-04-24 ENCOUNTER — Other Ambulatory Visit: Payer: Self-pay | Admitting: Family Medicine

## 2021-04-24 DIAGNOSIS — E119 Type 2 diabetes mellitus without complications: Secondary | ICD-10-CM

## 2021-04-26 DIAGNOSIS — E119 Type 2 diabetes mellitus without complications: Secondary | ICD-10-CM | POA: Diagnosis not present

## 2021-04-26 DIAGNOSIS — Z713 Dietary counseling and surveillance: Secondary | ICD-10-CM | POA: Diagnosis not present

## 2021-04-26 DIAGNOSIS — E669 Obesity, unspecified: Secondary | ICD-10-CM | POA: Diagnosis not present

## 2021-05-04 ENCOUNTER — Ambulatory Visit: Payer: Federal, State, Local not specified - PPO | Admitting: Gastroenterology

## 2021-05-28 ENCOUNTER — Other Ambulatory Visit: Payer: Self-pay

## 2021-05-28 ENCOUNTER — Telehealth (INDEPENDENT_AMBULATORY_CARE_PROVIDER_SITE_OTHER): Payer: Federal, State, Local not specified - PPO | Admitting: Family Medicine

## 2021-05-28 ENCOUNTER — Encounter: Payer: Self-pay | Admitting: Family Medicine

## 2021-05-28 DIAGNOSIS — K219 Gastro-esophageal reflux disease without esophagitis: Secondary | ICD-10-CM

## 2021-05-28 DIAGNOSIS — E119 Type 2 diabetes mellitus without complications: Secondary | ICD-10-CM

## 2021-05-28 DIAGNOSIS — E785 Hyperlipidemia, unspecified: Secondary | ICD-10-CM | POA: Diagnosis not present

## 2021-05-28 NOTE — Assessment & Plan Note (Addendum)
The patient notes that his symptoms have resolved at this time.  He notes he was contacted by GI though he deferred the appointment given his busy schedule.  I discussed having him see GI given his prior dysphagia to help determine if an EGD would be warranted.  A referral will be placed.

## 2021-05-28 NOTE — Progress Notes (Signed)
Virtual Visit via video Note  This visit type was conducted due to national recommendations for restrictions regarding the COVID-19 pandemic (e.g. social distancing).  This format is felt to be most appropriate for this patient at this time.  All issues noted in this document were discussed and addressed.  No physical exam was performed (except for noted visual exam findings with Video Visits).   I connected with Jermaine Ford today at  1:45 PM EST by a video enabled telemedicine application and verified that I am speaking with the correct person using two identifiers. Location patient: home Location provider: home office Persons participating in the virtual visit: patient, provider  I discussed the limitations, risks, security and privacy concerns of performing an evaluation and management service by telephone and the availability of in person appointments. I also discussed with the patient that there may be a patient responsible charge related to this service. The patient expressed understanding and agreed to proceed.  Reason for visit: f/u.  HPI: DIABETES Disease Monitoring: Blood Sugar ranges-101-173, the higher numbers are after he eats Polyuria/phagia/dipsia- no      Optho- due Medications: Compliance- taking metformin and ozempic Hypoglycemic symptoms- no  GERD:   Reflux symptoms: none   Abd pain: no   Blood in stool: no  Dysphagia: no   EGD: no  Medication: none currently, notes he stopped the omeprazole after a while as his symptoms resolved  HYPERLIPIDEMIA Symptoms Chest pain on exertion:  no   Medications: Compliance- taking crestor Right upper quadrant pain- no  Muscle aches- no Lipid Panel     Component Value Date/Time   CHOL 86 07/07/2020 1511   TRIG 192.0 (H) 07/07/2020 1511   HDL 35.00 (L) 07/07/2020 1511   CHOLHDL 2 07/07/2020 1511   VLDL 38.4 07/07/2020 1511   LDLCALC 12 07/07/2020 1511   LDLCALC 109 (H) 05/15/2020 1558   LDLDIRECT 31.0 05/28/2018  0855      ROS: See pertinent positives and negatives per HPI.  Past Medical History:  Diagnosis Date   Arthritis    Chicken pox    Diabetes mellitus without complication ([place])    Gout    Herniated cervical disc    C-5/C-6    C-6/C-7   Hyperlipidemia    Hyperlipidemia 5/00/9381   Umbilical hernia 02/22/9370    Past Surgical History:  Procedure Laterality Date   BACK SURGERY  2012   herniated disk replaced./C5-C7   CHOLECYSTECTOMY  2002   COLONOSCOPY     COLONOSCOPY WITH PROPOFOL N/A 06/04/2020   Procedure: COLONOSCOPY WITH PROPOFOL;  Surgeon: Jonathon Bellows, MD;  Location: [facility] ENDOSCOPY;  Service: Gastroenterology;  Laterality: N/A;   UPPER GASTROINTESTINAL ENDOSCOPY     VENTRAL HERNIA REPAIR N/A 12/25/2017   Procedure: HERNIA REPAIR VENTRAL ADULT;  Surgeon: Robert Bellow, MD;  Location: [facility] ORS;  Service: General;  Laterality: N/A;    Family History  Problem Relation Age of Onset   Breast cancer Mother    Diabetes Mother    Hypertension Mother    Brain cancer Mother    Arthritis Maternal Grandmother    Hypertension Maternal Grandmother    Diabetes Maternal Grandmother    Lung cancer Paternal Grandmother    Prostate cancer Paternal Uncle    Colon cancer Neg Hx    Esophageal cancer Neg Hx    Pancreatic cancer Neg Hx    Rectal cancer Neg Hx    Stomach cancer Neg Hx     SOCIAL HX: nonsmoker  Current Outpatient Medications:    allopurinol (ZYLOPRIM) 300 MG tablet, TAKE ONE TABLET BY MOUTH DAILY, Disp: 90 tablet, Rfl: 1   blood glucose meter kit and supplies KIT, Dispense based on patient and insurance preference.  Check glucose fasting daily (ICD 10 code E11.9)., Disp: 1 each, Rfl: 0   colchicine 0.6 MG tablet, Take 1.2 mg (2 tablets) PO at the first sign of flare, followed in 1 hour with a single dose of 0.6 mg (1 tablet) PO, starting the next day take 0.6 mg (1 tablet) PO daily until gout flare has resolved, Disp: 30 tablet, Rfl: 1   ezetimibe (ZETIA) 10 MG  tablet, Take 1 tablet (10 mg total) by mouth daily., Disp: 90 tablet, Rfl: 3   GAVILYTE-G 236 g solution, SMARTSIG:Milliliter(s) By Mouth, Disp: , Rfl:    metFORMIN (GLUCOPHAGE-XR) 500 MG 24 hr tablet, TAKE FOUR TABLETS EVERY DAY WITH BREAKFAST, Disp: 360 tablet, Rfl: 2   omeprazole (PRILOSEC) 20 MG capsule, Take 1 capsule (20 mg total) by mouth daily., Disp: 30 capsule, Rfl: 3   OneTouch Delica Lancets 23F MISC, , Disp: , Rfl:    ONETOUCH VERIO test strip, , Disp: , Rfl:    OZEMPIC, 0.25 OR 0.5 MG/DOSE, 2 MG/1.5ML SOPN, INJECT 0.25 MG INTO SKIN ONCE A WEEK FOR28 DAYS, THEN 0.5 MG ONCE A WEEK., Disp: 4.5 mL, Rfl: 1   rosuvastatin (CRESTOR) 40 MG tablet, Take 1 tablet (40 mg total) by mouth daily., Disp: 90 tablet, Rfl: 3  Current Facility-Administered Medications:    0.9 %  sodium chloride infusion, 500 mL, Intravenous, Continuous, Pyrtle, Lajuan Lines, MD  EXAM:  VITALS per patient if applicable:  GENERAL: alert, oriented, appears well and in no acute distress  HEENT: atraumatic, conjunttiva clear, no obvious abnormalities on inspection of external nose and ears  NECK: normal movements of the head and neck  LUNGS: on inspection no signs of respiratory distress, breathing rate appears normal, no obvious gross SOB, gasping or wheezing  CV: no obvious cyanosis  MS: moves all visible extremities without noticeable abnormality  PSYCH/NEURO: pleasant and cooperative, no obvious depression or anxiety, speech and thought processing grossly intact  ASSESSMENT AND PLAN:  Discussed the following assessment and plan:  Problem List Items Addressed This Visit     GERD (gastroesophageal reflux disease)    The patient notes that his symptoms have resolved at this time.  He notes he was contacted by GI though he deferred the appointment given his busy schedule.  I discussed having him see GI given his prior dysphagia to help determine if an EGD would be warranted.  A referral will be placed.       Relevant Orders   Ambulatory referral to Gastroenterology   Hyperlipidemia    Continue Crestor 40 mg once daily.  Check lipid panel.      Relevant Orders   Lipid panel   Comp Met (CMET)   Type 2 diabetes mellitus without complication, without long-term current use of insulin (HCC)    Seems to be decently well controlled.  He will come in for lab work.  He will continue Ozempic 0.5 mg once weekly and metformin XR 2000 mg daily.      Relevant Orders   HgB A1c   Urine Microalbumin w/creat. ratio   Health maintenance: The patient declines a flu vaccine and pneumonia vaccine.  He was encouraged to see his eye doctor on a yearly basis for diabetic eye exam.   Return in about 1  week (around 06/04/2021) for labs, 6 months pcp for DM.   I discussed the assessment and treatment plan with the patient. The patient was provided an opportunity to ask questions and all were answered. The patient agreed with the plan and demonstrated an understanding of the instructions.   The patient was advised to call back or seek an in-person evaluation if the symptoms worsen or if the condition fails to improve as anticipated.    Tommi Rumps, MD

## 2021-05-28 NOTE — Assessment & Plan Note (Signed)
Seems to be decently well controlled.  He will come in for lab work.  He will continue Ozempic 0.5 mg once weekly and metformin XR 2000 mg daily.

## 2021-05-28 NOTE — Assessment & Plan Note (Signed)
Continue Crestor 40 mg once daily.  Check lipid panel.

## 2021-07-12 DIAGNOSIS — Z713 Dietary counseling and surveillance: Secondary | ICD-10-CM | POA: Diagnosis not present

## 2021-07-21 ENCOUNTER — Other Ambulatory Visit: Payer: Self-pay | Admitting: Family Medicine

## 2021-08-02 DIAGNOSIS — E119 Type 2 diabetes mellitus without complications: Secondary | ICD-10-CM | POA: Diagnosis not present

## 2021-08-02 DIAGNOSIS — H524 Presbyopia: Secondary | ICD-10-CM | POA: Diagnosis not present

## 2021-08-02 LAB — HM DIABETES EYE EXAM

## 2021-08-03 ENCOUNTER — Encounter: Payer: Self-pay | Admitting: Gastroenterology

## 2021-08-03 ENCOUNTER — Ambulatory Visit: Payer: Federal, State, Local not specified - PPO | Admitting: Gastroenterology

## 2021-08-03 ENCOUNTER — Other Ambulatory Visit: Payer: Self-pay

## 2021-08-03 VITALS — BP 122/82 | HR 74 | Temp 98.1°F | Ht 70.0 in | Wt 248.0 lb

## 2021-08-03 DIAGNOSIS — R1319 Other dysphagia: Secondary | ICD-10-CM | POA: Diagnosis not present

## 2021-08-03 NOTE — Progress Notes (Signed)
Primary Care Physician: Leone Haven, MD  Primary Gastroenterologist:  Dr. Lucilla Lame  Chief Complaint  Patient presents with   New Patient (Initial Visit)   Gastroesophageal Reflux   Dysphagia    HPI: Jermaine Ford is a 56 y.o. male here with a history of being seen by Dr. Vicente Males in the past for colonoscopy.  The patient has a history of heartburn and was seen by his primary care provider.  The patient states that he was given omeprazole for his heartburn but never got it filled but was reporting dysphagia.  He was recommended to follow-up with GI due to dysphagia and be evaluated for possible EGD. He reports it to be only to solids and not liquids. He says it has been stable but still feels like it is getting stuck.  Past Medical History:  Diagnosis Date   Arthritis    Chicken pox    Diabetes mellitus without complication (HCC)    Gout    Herniated cervical disc    C-5/C-6    C-6/C-7   Hyperlipidemia    Hyperlipidemia 8/91/6945   Umbilical hernia 0/38/8828    Current Outpatient Medications  Medication Sig Dispense Refill   allopurinol (ZYLOPRIM) 300 MG tablet TAKE ONE TABLET BY MOUTH DAILY 90 tablet 1   blood glucose meter kit and supplies KIT Dispense based on patient and insurance preference.  Check glucose fasting daily (ICD 10 code E11.9). 1 each 0   colchicine 0.6 MG tablet Take 1.2 mg (2 tablets) PO at the first sign of flare, followed in 1 hour with a single dose of 0.6 mg (1 tablet) PO, starting the next day take 0.6 mg (1 tablet) PO daily until gout flare has resolved 30 tablet 1   metFORMIN (GLUCOPHAGE-XR) 500 MG 24 hr tablet TAKE FOUR TABLETS EVERY DAY WITH BREAKFAST 360 tablet 2   OneTouch Delica Lancets 00L MISC      ONETOUCH VERIO test strip      OZEMPIC, 0.25 OR 0.5 MG/DOSE, 2 MG/1.5ML SOPN INJECT 0.25 MG INTO SKIN ONCE A WEEK FOR28 DAYS, THEN 0.5 MG ONCE A WEEK. 4.5 mL 1   Current Facility-Administered Medications  Medication Dose Route Frequency  Provider Last Rate Last Admin   0.9 %  sodium chloride infusion  500 mL Intravenous Continuous Pyrtle, Lajuan Lines, MD        Allergies as of 08/03/2021   (No Known Allergies)    ROS:  General: Negative for anorexia, weight loss, fever, chills, fatigue, weakness. ENT: Negative for hoarseness, difficulty swallowing , nasal congestion. CV: Negative for chest pain, angina, palpitations, dyspnea on exertion, peripheral edema.  Respiratory: Negative for dyspnea at rest, dyspnea on exertion, cough, sputum, wheezing.  GI: See history of present illness. GU:  Negative for dysuria, hematuria, urinary incontinence, urinary frequency, nocturnal urination.  Endo: Negative for unusual weight change.    Physical Examination:   BP 122/82    Pulse 74    Temp 98.1 F (36.7 C) (Oral)    Ht 5' 10"  (1.778 m)    Wt 248 lb (112.5 kg)    BMI 35.58 kg/m   General: Well-nourished, well-developed in no acute distress.  Eyes: No icterus. Conjunctivae pink. Lungs: Clear to auscultation bilaterally. Non-labored. Heart: Regular rate and rhythm, no murmurs rubs or gallops.  Abdomen: Bowel sounds are normal, nontender, nondistended, no hepatosplenomegaly or masses, no abdominal bruits or hernia , no rebound or guarding.   Extremities: No lower extremity edema. No clubbing  or deformities. Neuro: Alert and oriented x 3.  Grossly intact. Skin: Warm and dry, no jaundice.   Psych: Alert and cooperative, normal mood and affect.  Labs:    Imaging Studies: No results found.  Assessment and Plan:   Jermaine Ford is a 56 y.o. y/o male who comes in today with a history of dysphagia.  The patient states that his dysphagia is to solids more than liquids.  He has not had any unexplained weight loss fevers chills nausea vomiting black stools or bloody stools.  The patient will be set up for an EGD to look for any strictures neoplasms or other pathology that may be causing his dysphagia.  The patient has been explained the  plan and agrees with it.     Lucilla Lame, MD. [facility]    Note: This dictation was prepared with Dragon dictation along with smaller phrase technology. Any transcriptional errors that result from this process are unintentional.

## 2021-08-03 NOTE — Addendum Note (Signed)
Addended by: Lurlean Nanny on: 08/03/2021 04:51 PM   Modules accepted: Orders

## 2021-08-04 ENCOUNTER — Telehealth: Payer: Self-pay

## 2021-08-04 NOTE — Telephone Encounter (Signed)
Patient left a voicemail and he states he needs to reschedule his EGD that is schedule for 09/14/21 to 09/07/21 if possible

## 2021-08-05 NOTE — Telephone Encounter (Signed)
Left message on voicemail 3/14 is no longer available... 3/28 is open

## 2021-08-06 NOTE — Telephone Encounter (Signed)
Left detailed msg on VM per HIPAA  

## 2021-08-06 NOTE — Telephone Encounter (Signed)
Pt r/t my call and procedure moved 3/28

## 2021-08-17 ENCOUNTER — Ambulatory Visit: Payer: Federal, State, Local not specified - PPO | Admitting: Adult Health

## 2021-08-19 ENCOUNTER — Ambulatory Visit: Payer: Federal, State, Local not specified - PPO | Admitting: Adult Health

## 2021-09-09 DIAGNOSIS — R972 Elevated prostate specific antigen [PSA]: Secondary | ICD-10-CM | POA: Diagnosis not present

## 2021-09-09 DIAGNOSIS — K219 Gastro-esophageal reflux disease without esophagitis: Secondary | ICD-10-CM | POA: Diagnosis not present

## 2021-09-09 DIAGNOSIS — Z13220 Encounter for screening for lipoid disorders: Secondary | ICD-10-CM | POA: Diagnosis not present

## 2021-09-09 DIAGNOSIS — Z0001 Encounter for general adult medical examination with abnormal findings: Secondary | ICD-10-CM | POA: Diagnosis not present

## 2021-09-09 DIAGNOSIS — R197 Diarrhea, unspecified: Secondary | ICD-10-CM | POA: Diagnosis not present

## 2021-09-09 DIAGNOSIS — E119 Type 2 diabetes mellitus without complications: Secondary | ICD-10-CM | POA: Diagnosis not present

## 2021-09-09 DIAGNOSIS — Z1331 Encounter for screening for depression: Secondary | ICD-10-CM | POA: Diagnosis not present

## 2021-09-14 DIAGNOSIS — R197 Diarrhea, unspecified: Secondary | ICD-10-CM | POA: Diagnosis not present

## 2021-09-21 ENCOUNTER — Ambulatory Visit: Payer: Federal, State, Local not specified - PPO | Admitting: Anesthesiology

## 2021-09-21 ENCOUNTER — Encounter: Payer: Self-pay | Admitting: Gastroenterology

## 2021-09-21 ENCOUNTER — Encounter
Admission: RE | Disposition: A | Discharge status: Home / Self Care | Payer: Self-pay | Source: Home / Self Care | Attending: Gastroenterology

## 2021-09-21 ENCOUNTER — Ambulatory Visit
Admission: RE | Admit: 2021-09-21 | Discharge: 2021-09-21 | Disposition: A | Discharge status: Home / Self Care | Payer: Federal, State, Local not specified - PPO | Attending: Gastroenterology | Admitting: Gastroenterology

## 2021-09-21 DIAGNOSIS — K2282 Esophagogastric junction polyp: Secondary | ICD-10-CM | POA: Diagnosis not present

## 2021-09-21 DIAGNOSIS — K2100 Gastro-esophageal reflux disease with esophagitis, without bleeding: Secondary | ICD-10-CM | POA: Insufficient documentation

## 2021-09-21 DIAGNOSIS — K2970 Gastritis, unspecified, without bleeding: Secondary | ICD-10-CM | POA: Diagnosis not present

## 2021-09-21 DIAGNOSIS — R1319 Other dysphagia: Secondary | ICD-10-CM | POA: Diagnosis not present

## 2021-09-21 DIAGNOSIS — R1310 Dysphagia, unspecified: Secondary | ICD-10-CM | POA: Diagnosis not present

## 2021-09-21 DIAGNOSIS — K319 Disease of stomach and duodenum, unspecified: Secondary | ICD-10-CM | POA: Diagnosis not present

## 2021-09-21 DIAGNOSIS — E119 Type 2 diabetes mellitus without complications: Secondary | ICD-10-CM | POA: Diagnosis not present

## 2021-09-21 HISTORY — PX: ESOPHAGOGASTRODUODENOSCOPY (EGD) WITH PROPOFOL: SHX5813

## 2021-09-21 LAB — GLUCOSE, CAPILLARY: Glucose-Capillary: 147 mg/dL — ABNORMAL HIGH (ref 70–99)

## 2021-09-21 SURGERY — ESOPHAGOGASTRODUODENOSCOPY (EGD) WITH PROPOFOL
Anesthesia: General

## 2021-09-21 MED ORDER — LIDOCAINE HCL (CARDIAC) PF 100 MG/5ML IV SOSY
PREFILLED_SYRINGE | INTRAVENOUS | Status: DC | PRN
Start: 2021-09-21 — End: 2021-09-21
  Administered 2021-09-21: 100 mg via INTRAVENOUS

## 2021-09-21 MED ORDER — PROPOFOL 500 MG/50ML IV EMUL
INTRAVENOUS | Status: AC
  Filled 2021-09-21: qty 50

## 2021-09-21 MED ORDER — DEXMEDETOMIDINE (PRECEDEX) IN NS 20 MCG/5ML (4 MCG/ML) IV SYRINGE
PREFILLED_SYRINGE | INTRAVENOUS | Status: DC | PRN
  Administered 2021-09-21: 8 ug via INTRAVENOUS

## 2021-09-21 MED ORDER — SODIUM CHLORIDE 0.9 % IV SOLN
INTRAVENOUS | Status: DC
Start: 2021-09-21 — End: 2021-09-21

## 2021-09-21 MED ORDER — PROPOFOL 500 MG/50ML IV EMUL
INTRAVENOUS | Status: DC | PRN
  Administered 2021-09-21: 150 ug/kg/min via INTRAVENOUS

## 2021-09-21 MED ORDER — PROPOFOL 10 MG/ML IV BOLUS
INTRAVENOUS | Status: DC | PRN
Start: 2021-09-21 — End: 2021-09-21
  Administered 2021-09-21: 80 mg via INTRAVENOUS

## 2021-09-21 NOTE — Transfer of Care (Signed)
Immediate Anesthesia Transfer of Care Note ? ?Patient: Jermaine Ford ? ?Procedure(s) Performed: ESOPHAGOGASTRODUODENOSCOPY (EGD) WITH PROPOFOL ? ?Patient Location: PACU ? ?Anesthesia Type:General ? ?Level of Consciousness: awake, alert  and oriented ? ?Airway & Oxygen Therapy: Patient Spontanous Breathing ? ?Post-op Assessment: Report given to RN and Post -op Vital signs reviewed and stable ? ?Post vital signs: Reviewed and stable ? ?Last Vitals:  ?Vitals Value Taken Time  ?BP    ?Temp    ?Pulse    ?Resp    ?SpO2    ? ? ?Last Pain:  ?Vitals:  ? 09/21/21 0731  ?TempSrc: Temporal  ?   ? ?  ? ?Complications: No notable events documented. ?

## 2021-09-21 NOTE — Op Note (Signed)
[facility] ?Gastroenterology ?Patient Name: Jermaine Ford ?Procedure Date: 09/21/2021 8:50 AM ?MRN: [id number] ?Account #: [id number] ?Date of Birth: 1966/01/17 ?Admit Type: Outpatient ?Age: 56 ?Room: [facility] ENDO ROOM 4 ?Gender: Male ?Note Status: Finalized ?Instrument Name: Upper Endoscope [id number] ?Procedure:             Upper GI endoscopy ?Indications:           Dysphagia ?Providers:             Lucilla Lame MD, MD ?Referring MD:          Angela Adam. Caryl Bis (Referring MD) ?Medicines:             Propofol per Anesthesia ?Complications:         No immediate complications. ?Procedure:             Pre-Anesthesia Assessment: ?                       - Prior to the procedure, a History and Physical was  ?                       performed, and patient medications and allergies were  ?                       reviewed. The patient's tolerance of previous  ?                       anesthesia was also reviewed. The risks and benefits  ?                       of the procedure and the sedation options and risks  ?                       were discussed with the patient. All questions were  ?                       answered, and informed consent was obtained. Prior  ?                       Anticoagulants: The patient has taken no previous  ?                       anticoagulant or antiplatelet agents. ASA Grade  ?                       Assessment: II - A patient with mild systemic disease.  ?                       After reviewing the risks and benefits, the patient  ?                       was deemed in satisfactory condition to undergo the  ?                       procedure. ?                       After obtaining informed consent, the endoscope was  ?  passed under direct vision. Throughout the procedure,  ?                       the patient's blood pressure, pulse, and oxygen  ?                       saturations were monitored continuously. The Endoscope  ?                       was introduced  through the mouth, and advanced to the  ?                       second part of duodenum. The upper GI endoscopy was  ?                       accomplished without difficulty. The patient tolerated  ?                       the procedure well. ?Findings: ?     LA Grade B (one or more mucosal breaks greater than 5 mm, not extending  ?     between the tops of two mucosal folds) esophagitis with no bleeding was  ?     found in the lower third of the esophagus. ?     A single polyp with no bleeding was found at the gastroesophageal  ?     junction. This was biopsied with a cold forceps for histology. ?     Biopsies were obtained with cold forceps for histology in the middle  ?     third of the esophagus. ?     A TTS dilator was passed through the scope. Dilation with a 15-16.5-18  ?     mm balloon dilator was performed to 18 mm in the entire esophagus. ?     Localized mild inflammation characterized by erythema was found in the  ?     gastric antrum. Biopsies were taken with a cold forceps for histology. ?     The examined duodenum was normal. ?Impression:            - LA Grade B reflux esophagitis with no bleeding. ?                       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  ?                       Biopsied. ?                       - Gastritis. Biopsied. ?                       - Normal examined duodenum. ?                       - Biopsy performed in the middle third of the  ?                       esophagus. ?                       - Dilation performed in the entire esophagus. ?Recommendation:        -  Discharge patient to home. ?                       - Resume previous diet. ?                       - Continue present medications. ?                       - Await pathology results. ?Procedure Code(s):     --- Professional --- ?                       [id number], Esophagogastroduodenoscopy, flexible,  ?                       transoral; with transendoscopic balloon dilation of  ?                       esophagus (less  than 30 mm diameter) ?                       43239, 59, Esophagogastroduodenoscopy, flexible,  ?                       transoral; with biopsy, single or multiple ?Diagnosis Code(s):     --- Professional --- ?                       R13.10, Dysphagia, unspecified ?                       K29.70, Gastritis, unspecified, without bleeding ?                       K21.00, Gastro-esophageal reflux disease with  ?                       esophagitis, without bleeding ?CPT copyright 2019 American Medical Association. All rights reserved. ?The codes documented in this report are preliminary and upon coder review may  ?be revised to meet current compliance requirements. ?Lucilla Lame MD, MD ?09/21/2021 9:08:53 AM ?This report has been signed electronically. ?Number of Addenda: 0 ?Note Initiated On: 09/21/2021 8:50 AM ?Estimated Blood Loss:  Estimated blood loss: none. ?     [facility] ?

## 2021-09-21 NOTE — H&P (Signed)
? ?Lucilla Lame, MD [facility] ?[address] ?[place], [place] [address] ?Phone:[phone] ?Fax : [phone] ? ?Primary Care Physician:  Leone Haven, MD ?Primary Gastroenterologist:  Dr. Allen Norris ? ?Pre-Procedure History & Physical: ?HPI:  Jermaine Ford is a 56 y.o. male is here for an endoscopy. ?  ?Past Medical History:  ?Diagnosis Date  ? Arthritis   ? Chicken pox   ? Diabetes mellitus without complication ([place])   ? Gout   ? Herniated cervical disc   ? C-5/C-6    C-6/C-7  ? Hyperlipidemia   ? Hyperlipidemia 01/11/2016  ? Umbilical hernia 3/53/6144  ? ? ?Past Surgical History:  ?Procedure Laterality Date  ? BACK SURGERY  2012  ? herniated disk replaced./C5-C7  ? CHOLECYSTECTOMY  2002  ? COLONOSCOPY    ? COLONOSCOPY WITH PROPOFOL N/A 06/04/2020  ? Procedure: COLONOSCOPY WITH PROPOFOL;  Surgeon: Jonathon Bellows, MD;  Location: [facility] ENDOSCOPY;  Service: Gastroenterology;  Laterality: N/A;  ? UPPER GASTROINTESTINAL ENDOSCOPY    ? VENTRAL HERNIA REPAIR N/A 12/25/2017  ? Procedure: HERNIA REPAIR VENTRAL ADULT;  Surgeon: Robert Bellow, MD;  Location: [facility] ORS;  Service: General;  Laterality: N/A;  ? ? ?Prior to Admission medications   ?Medication Sig Start Date End Date Taking? Authorizing Provider  ?allopurinol (ZYLOPRIM) 300 MG tablet TAKE ONE TABLET BY MOUTH DAILY 04/07/21  Yes Leone Haven, MD  ?metFORMIN (GLUCOPHAGE-XR) 500 MG 24 hr tablet TAKE FOUR TABLETS EVERY DAY WITH BREAKFAST 04/07/21  Yes Leone Haven, MD  ?blood glucose meter kit and supplies KIT Dispense based on patient and insurance preference.  Check glucose fasting daily (ICD 10 code E11.9). 12/10/18   Leone Haven, MD  ?colchicine 0.6 MG tablet Take 1.2 mg (2 tablets) PO at the first sign of flare, followed in 1 hour with a single dose of 0.6 mg (1 tablet) PO, starting the next day take 0.6 mg (1 tablet) PO daily until gout flare has resolved 01/08/21   Leone Haven, MD  ?Jonetta Speak Lancets 31V [place]  03/15/19    [provider]  ?Roma Schanz test strip  03/15/19   [provider]  ?OZEMPIC, 0.25 OR 0.5 MG/DOSE, 2 MG/1.5ML SOPN INJECT 0.25 MG INTO SKIN ONCE A WEEK FOR28 DAYS, THEN 0.5 MG ONCE A WEEK. 04/27/21   Leone Haven, MD  ? ? ?Allergies as of 08/04/2021  ? (No Known Allergies)  ? ? ?Family History  ?Problem Relation Age of Onset  ? Breast cancer Mother   ? Diabetes Mother   ? Hypertension Mother   ? Brain cancer Mother   ? Arthritis Maternal Grandmother   ? Hypertension Maternal Grandmother   ? Diabetes Maternal Grandmother   ? Lung cancer Paternal Grandmother   ? Prostate cancer Paternal Uncle   ? Colon cancer Neg Hx   ? Esophageal cancer Neg Hx   ? Pancreatic cancer Neg Hx   ? Rectal cancer Neg Hx   ? Stomach cancer Neg Hx   ? ? ?Social History  ? ?Socioeconomic History  ? Marital status: Married  ?  Spouse name: Not on file  ? Number of children: 2  ? Years of education: Not on file  ? Highest education level: Not on file  ?Occupational History  ?  Employer: [employer]  ?Tobacco Use  ? Smoking status: Never  ? Smokeless tobacco: Never  ?Vaping Use  ? Vaping Use: Never used  ?Substance and Sexual Activity  ? Alcohol use: Yes  ?  Alcohol/week: 0.0 - 1.0 standard drinks  ?  Comment: rarely  ? Drug use: No  ? Sexual activity: Not on file  ?Other Topics Concern  ? Not on file  ?Social History Narrative  ? Not on file  ? ?Social Determinants of Health  ? ?Financial Resource Strain: Not on file  ?Food Insecurity: Not on file  ?Transportation Needs: Not on file  ?Physical Activity: Not on file  ?Stress: Not on file  ?Social Connections: Not on file  ?Intimate Partner Violence: Not on file  ? ? ?Review of Systems: ?See HPI, otherwise negative ROS ? ?Physical Exam: ?BP 129/83   Pulse 74   Temp (!) 97.4 ?F (36.3 ?C) (Temporal)   Resp 16   Ht 5' 10" (1.778 m)   Wt 111.1 kg   SpO2 97%   BMI 35.15 kg/m?  ?General:   Alert,  pleasant and cooperative in NAD ?Head:  Normocephalic and atraumatic. ?Neck:   Supple; no masses or thyromegaly. ?Lungs:  Clear throughout to auscultation.    ?Heart:  Regular rate and rhythm. ?Abdomen:  Soft, nontender and nondistended. Normal bowel sounds, without guarding, and without rebound.   ?Neurologic:  Alert and  oriented x4;  grossly normal neurologically. ? ?Impression/Plan: ?Jermaine Ford is here for an endoscopy to be performed for dysphagia ? ?Risks, benefits, limitations, and alternatives regarding  endoscopy have been reviewed with the patient.  Questions have been answered.  All parties agreeable. ? ? ?Lucilla Lame, MD  09/21/2021, 8:42 AM ?

## 2021-09-21 NOTE — Anesthesia Preprocedure Evaluation (Signed)
Anesthesia Evaluation  ?Patient identified by MRN, date of birth, ID band ?Patient awake ? ? ? ?Reviewed: ?Allergy & Precautions, NPO status , Patient's Chart, lab work & pertinent test results ? ?History of Anesthesia Complications ?Negative for: history of anesthetic complications ? ?Airway ?Mallampati: III ? ?TM Distance: <3 FB ?Neck ROM: full ? ? ? Dental ? ?(+) Chipped ?  ?Pulmonary ?neg pulmonary ROS, neg shortness of breath,  ?  ?Pulmonary exam normal ? ? ? ? ? ? ? Cardiovascular ?Exercise Tolerance: Good ?(-) angina(-) Past MI negative cardio ROS ?Normal cardiovascular exam ? ? ?  ?Neuro/Psych ? Neuromuscular disease negative psych ROS  ? GI/Hepatic ?Neg liver ROS, GERD  Controlled,  ?Endo/Other  ?diabetes, Type 2 ? Renal/GU ?negative Renal ROS  ?negative genitourinary ?  ?Musculoskeletal ? ?(+) Arthritis ,  ? Abdominal ?  ?Peds ? Hematology ?negative hematology ROS ?(+)   ?Anesthesia Other Findings ?Past Medical History: ?No date: Arthritis ?No date: Chicken pox ?No date: Diabetes mellitus without complication ([place]) ?No date: Gout ?No date: Herniated cervical disc ?    Comment:  C-5/C-6    C-6/C-7 ?No date: Hyperlipidemia ?01/11/2016: Hyperlipidemia ?7/94/8016: Umbilical hernia ? ?Past Surgical History: ?2012: BACK SURGERY ?    Comment:  herniated disk replaced./C5-C7 ?2002: CHOLECYSTECTOMY ?No date: COLONOSCOPY ?06/04/2020: COLONOSCOPY WITH PROPOFOL; N/A ?    Comment:  Procedure: COLONOSCOPY WITH PROPOFOL;  Surgeon: Vicente Males,  ?             Bailey Mech, MD;  Location: [facility];  Service:  ?             Gastroenterology;  Laterality: N/A; ?No date: UPPER GASTROINTESTINAL ENDOSCOPY ?12/25/2017: VENTRAL HERNIA REPAIR; N/A ?    Comment:  Procedure: HERNIA REPAIR VENTRAL ADULT;  Surgeon:  ?             Robert Bellow, MD;  Location: [facility] ORS;  Service:  ?             General;  Laterality: N/A; ? ?BMI   ? Body Mass Index: 35.15 kg/m?  ?  ? ? Reproductive/Obstetrics ?negative OB  ROS ? ?  ? ? ? ? ? ? ? ? ? ? ? ? ? ?  ?  ? ? ? ? ? ? ? ? ?Anesthesia Physical ?Anesthesia Plan ? ?ASA: 2 ? ?Anesthesia Plan: General  ? ?Post-op Pain Management:   ? ?Induction: Intravenous ? ?PONV Risk Score and Plan: Propofol infusion and TIVA ? ?Airway Management Planned: Natural Airway and Nasal Cannula ? ?Additional Equipment:  ? ?Intra-op Plan:  ? ?Post-operative Plan:  ? ?Informed Consent: I have reviewed the patients History and Physical, chart, labs and discussed the procedure including the risks, benefits and alternatives for the proposed anesthesia with the patient or authorized representative who has indicated his/her understanding and acceptance.  ? ? ? ?Dental Advisory Given ? ?Plan Discussed with: Anesthesiologist, CRNA and Surgeon ? ?Anesthesia Plan Comments: (Patient consented for risks of anesthesia including but not limited to:  ?- adverse reactions to medications ?- risk of airway placement if required ?- damage to eyes, teeth, lips or other oral mucosa ?- nerve damage due to positioning  ?- sore throat or hoarseness ?- Damage to heart, brain, nerves, lungs, other parts of body or loss of life ? ?Patient voiced understanding.)  ? ? ? ? ? ? ?Anesthesia Quick Evaluation ? ?

## 2021-09-21 NOTE — Anesthesia Postprocedure Evaluation (Signed)
Anesthesia Post Note ? ?Patient: Jermaine Ford ? ?Procedure(s) Performed: ESOPHAGOGASTRODUODENOSCOPY (EGD) WITH PROPOFOL ? ?Patient location during evaluation: Endoscopy ?Anesthesia Type: General ?Level of consciousness: awake and alert ?Pain management: pain level controlled ?Vital Signs Assessment: post-procedure vital signs reviewed and stable ?Respiratory status: spontaneous breathing, nonlabored ventilation, respiratory function stable and patient connected to nasal cannula oxygen ?Cardiovascular status: blood pressure returned to baseline and stable ?Postop Assessment: no apparent nausea or vomiting ?Anesthetic complications: no ? ? ?No notable events documented. ? ? ?Last Vitals:  ?Vitals:  ? 09/21/21 0914 09/21/21 0920  ?BP: 126/74 110/75  ?Pulse: 72   ?Resp: 18   ?Temp: (!) 36.2 ?C   ?SpO2: 95%   ?  ?Last Pain:  ?Vitals:  ? 09/21/21 0930  ?TempSrc:   ?PainSc: 0-No pain  ? ? ?  ?  ?  ?  ?  ?  ? ?Precious Haws [name] ? ? ? ? ?

## 2021-09-22 ENCOUNTER — Encounter: Payer: Self-pay | Admitting: Gastroenterology

## 2021-09-23 LAB — SURGICAL PATHOLOGY

## 2021-09-27 ENCOUNTER — Encounter: Payer: Self-pay | Admitting: Gastroenterology

## 2021-09-27 DIAGNOSIS — E119 Type 2 diabetes mellitus without complications: Secondary | ICD-10-CM | POA: Diagnosis not present

## 2021-09-27 DIAGNOSIS — E669 Obesity, unspecified: Secondary | ICD-10-CM | POA: Diagnosis not present

## 2021-09-27 DIAGNOSIS — Z713 Dietary counseling and surveillance: Secondary | ICD-10-CM | POA: Diagnosis not present

## 2021-11-08 ENCOUNTER — Other Ambulatory Visit: Payer: Self-pay | Admitting: Family Medicine

## 2021-11-22 ENCOUNTER — Other Ambulatory Visit: Payer: Self-pay | Admitting: Family Medicine

## 2021-11-23 NOTE — Telephone Encounter (Signed)
Just FYI patient in the process of establishing with new PCP for I did refill for 30 days. I tried to get him to fu/ but patient not please with care & how our office does appointments. He said that that he would probably be fine with out medication but as a type 2 diabetic I did not want to deny refill. 30 day sent.

## 2021-11-24 NOTE — Telephone Encounter (Signed)
Noted. 30 day refill is fine. It looks like he already established with someone through [facility] in March.

## 2021-11-29 DIAGNOSIS — E119 Type 2 diabetes mellitus without complications: Secondary | ICD-10-CM | POA: Diagnosis not present

## 2021-11-29 DIAGNOSIS — Z713 Dietary counseling and surveillance: Secondary | ICD-10-CM | POA: Diagnosis not present

## 2021-11-29 DIAGNOSIS — E669 Obesity, unspecified: Secondary | ICD-10-CM | POA: Diagnosis not present

## 2021-12-14 DIAGNOSIS — E119 Type 2 diabetes mellitus without complications: Secondary | ICD-10-CM | POA: Diagnosis not present

## 2022-01-12 ENCOUNTER — Other Ambulatory Visit: Payer: Self-pay | Admitting: Family Medicine

## 2022-01-15 IMAGING — DX DG TOE GREAT 2+V*L*
3 series · 3 of 3 positions shown · non-contrast
Comparison: None.

CLINICAL DATA: Left great toe injury.  Stubbed toe.

EXAM:
LEFT GREAT TOE

[toes ap]
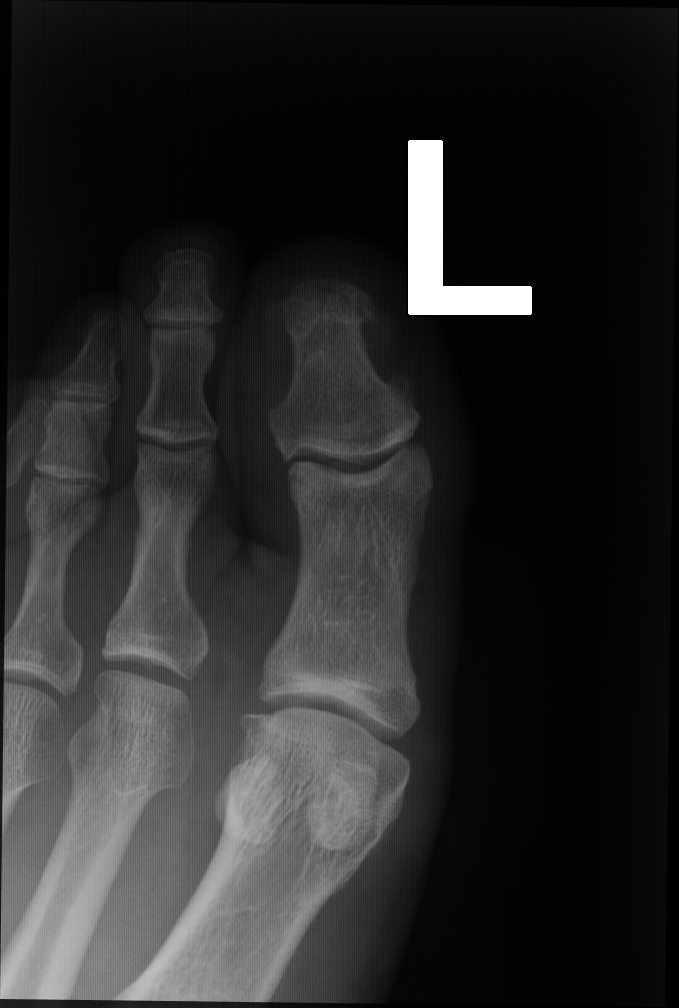

[toes mlo]
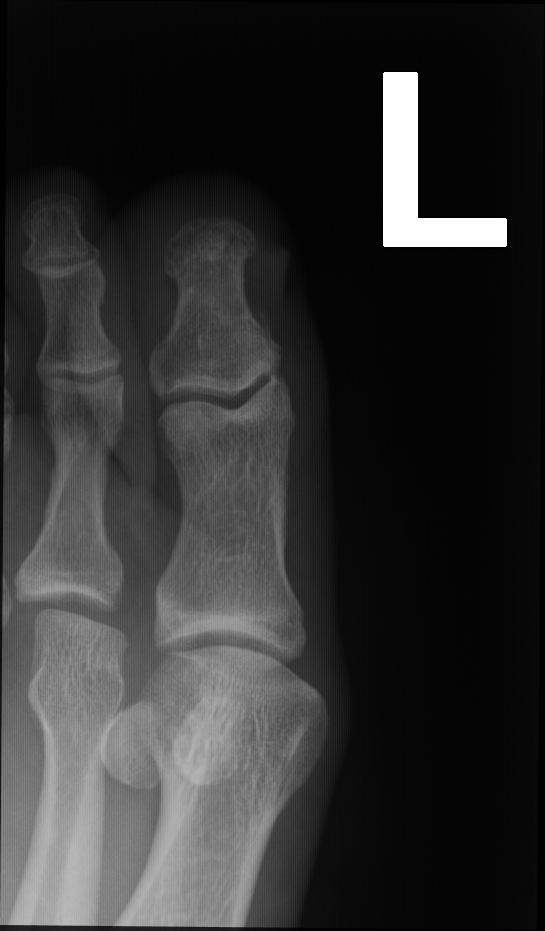

[toes lat]
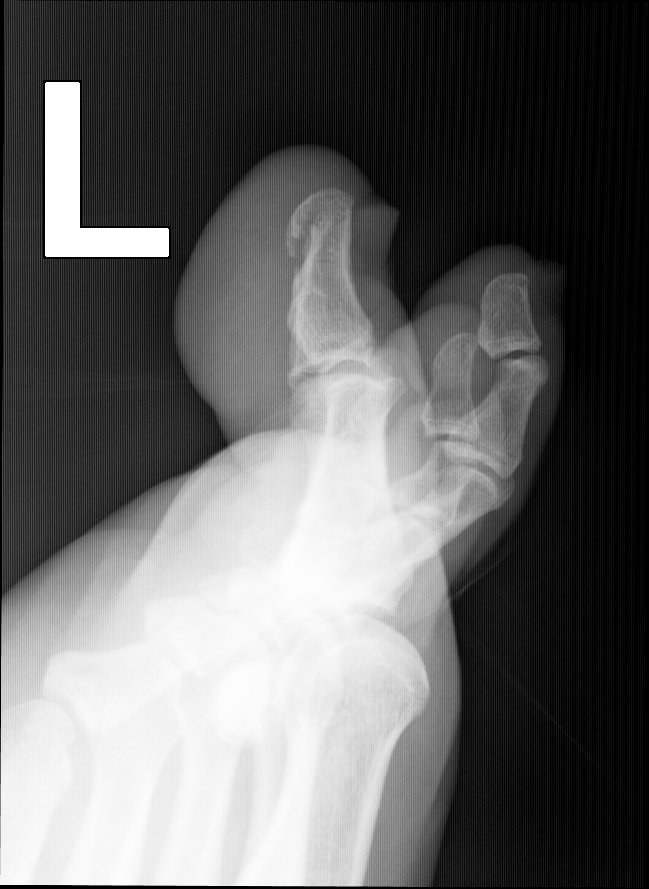

[3 of 3 positions shown; findings below may reference images not displayed]

FINDINGS: Findings suspicious for distal tuft fracture of the great toe
primarily involving the plantar surface. No other fracture. Mild
degenerative change with spurring. Soft tissue edema seen.
IMPRESSION: Findings suspicious for distal tuft fracture of the great toe.

## 2022-01-31 DIAGNOSIS — E119 Type 2 diabetes mellitus without complications: Secondary | ICD-10-CM | POA: Diagnosis not present

## 2022-02-10 ENCOUNTER — Other Ambulatory Visit: Payer: Self-pay | Admitting: Family

## 2022-03-07 ENCOUNTER — Other Ambulatory Visit: Payer: Self-pay | Admitting: Family Medicine

## 2022-03-08 DIAGNOSIS — E119 Type 2 diabetes mellitus without complications: Secondary | ICD-10-CM | POA: Diagnosis not present

## 2022-03-15 DIAGNOSIS — K219 Gastro-esophageal reflux disease without esophagitis: Secondary | ICD-10-CM | POA: Diagnosis not present

## 2022-03-15 DIAGNOSIS — E119 Type 2 diabetes mellitus without complications: Secondary | ICD-10-CM | POA: Diagnosis not present

## 2022-03-15 DIAGNOSIS — M109 Gout, unspecified: Secondary | ICD-10-CM | POA: Diagnosis not present

## 2022-03-15 DIAGNOSIS — Z125 Encounter for screening for malignant neoplasm of prostate: Secondary | ICD-10-CM | POA: Diagnosis not present

## 2022-03-21 DIAGNOSIS — E119 Type 2 diabetes mellitus without complications: Secondary | ICD-10-CM | POA: Diagnosis not present

## 2022-04-25 DIAGNOSIS — E119 Type 2 diabetes mellitus without complications: Secondary | ICD-10-CM | POA: Diagnosis not present

## 2022-04-25 DIAGNOSIS — Z713 Dietary counseling and surveillance: Secondary | ICD-10-CM | POA: Diagnosis not present

## 2022-04-25 DIAGNOSIS — E669 Obesity, unspecified: Secondary | ICD-10-CM | POA: Diagnosis not present

## 2023-03-05 ENCOUNTER — Other Ambulatory Visit: Payer: Self-pay

## 2023-03-05 ENCOUNTER — Encounter: Payer: Self-pay | Admitting: Emergency Medicine

## 2023-03-05 ENCOUNTER — Ambulatory Visit
Admission: EM | Admit: 2023-03-05 | Discharge: 2023-03-05 | Disposition: A | Discharge status: Home / Self Care | Payer: Federal, State, Local not specified - PPO

## 2023-03-05 DIAGNOSIS — M7918 Myalgia, other site: Secondary | ICD-10-CM

## 2023-03-05 HISTORY — DX: Other specified diseases of pancreas: K86.89

## 2023-03-05 MED ORDER — METHOCARBAMOL 500 MG PO TABS: 500.0000 mg | ORAL_TABLET | Freq: Two times a day (BID) | ORAL | 0 refills | Status: AC

## 2023-03-05 MED ORDER — NAPROXEN 375 MG PO TABS: 375.0000 mg | ORAL_TABLET | Freq: Two times a day (BID) | ORAL | 0 refills | Status: AC

## 2023-03-05 MED ORDER — LIDOCAINE 5 % EX PTCH: 1.0000 | MEDICATED_PATCH | CUTANEOUS | 0 refills | Status: AC

## 2023-03-05 NOTE — Discharge Instructions (Signed)
I am concerned that you have aggravated your piriformis muscle.  Do not put your wallet in your back pocket.  Please start Naprosyn twice a day.  Do not take other NSAIDs with this medication including aspirin, ibuprofen/Advil, naproxen/Aleve.  You can use acetaminophen/Tylenol for breakthrough pain.  Take Robaxin up to 2 times a day.  This will make you sleepy so do not drive or drink alcohol taking it.  Apply lidocaine patch for 12 hours during the day; remove this for 12 hours at night.  Use only 1 patch per 24 hours.  I recommend you follow-up with sports medicine; call to schedule an appointment.  If anything worsens and you have severe pain, going to the bathroom on yourself without noticing it, numbness or tingling in your legs, weakness in your legs you need to be seen immediately.

## 2023-03-05 NOTE — ED Provider Notes (Signed)
[facility]    CSN: [id number] Arrival date & time: 03/05/23  0935      History   Chief Complaint Chief Complaint  Patient presents with   Hip Pain    HPI Jermaine Ford is a 57 y.o. male.   Patient presents today with a 2-week history of right buttocks pain.  He denies any known injury or increase in activity prior to symptom onset.  Denies previous injury or surgery involving his hip or lower back.  Denies any bowel/bladder incontinence, lower extremity weakness, saddle anesthesia, numbness or paresthesias.  He is able to ambulate without assistance.  Reports pain is rated 8/9 on a 0-10 pain scale, described as intense aching, no aggravating relieving factors identified.  He has tried over-the-counter Tylenol and ibuprofen without improvement of symptoms.  He denies any personal history of malignancy.  He does usually put his wallet in his right back pocket but has not been doing this since the symptoms began as it exacerbates pain.  He denies any recent falls or trauma.    Past Medical History:  Diagnosis Date   Arthritis    Chicken pox    Diabetes mellitus without complication (HCC)    Gout    Herniated cervical disc    C-5/C-6    C-6/C-7   Hyperlipidemia    Hyperlipidemia 01/11/2016   Pancreatic insufficiency    Umbilical hernia 01/04/2016    Patient Active Problem List   Diagnosis Date Noted   Esophageal dysphagia    Gastritis without bleeding    GERD (gastroesophageal reflux disease) 01/08/2021   Right upper quadrant pain 05/15/2020   Colon cancer screening 05/15/2020   Reticulocytosis 05/30/2019   Cervical radiculopathy 08/10/2018   Onychomycosis 08/04/2017   Obesity 08/04/2017   Thoracic back pain 02/01/2017   Hyperlipidemia 01/11/2016   Type 2 diabetes mellitus without complication, without long-term current use of insulin (HCC) 01/04/2016   Primary gout 01/04/2016    Past Surgical History:  Procedure Laterality Date   BACK SURGERY  2012    herniated disk replaced./C5-C7   CHOLECYSTECTOMY  2002   COLONOSCOPY     COLONOSCOPY WITH PROPOFOL N/A 06/04/2020   Procedure: COLONOSCOPY WITH PROPOFOL;  Surgeon: Wyline Mood, MD;  Location: [facility];  Service: Gastroenterology;  Laterality: N/A;   ESOPHAGOGASTRODUODENOSCOPY (EGD) WITH PROPOFOL N/A 09/21/2021   Procedure: ESOPHAGOGASTRODUODENOSCOPY (EGD) WITH PROPOFOL;  Surgeon: Midge Minium, MD;  Location: [facility];  Service: Endoscopy;  Laterality: N/A;   UPPER GASTROINTESTINAL ENDOSCOPY     VENTRAL HERNIA REPAIR N/A 12/25/2017   Procedure: HERNIA REPAIR VENTRAL ADULT;  Surgeon: Earline Mayotte, MD;  Location: [facility] ORS;  Service: General;  Laterality: N/A;       Home Medications    Prior to Admission medications   Medication Sig Start Date End Date Taking? Authorizing Provider  glipiZIDE (GLUCOTROL) 5 MG tablet Take by mouth daily before breakfast.   Yes [provider]  lidocaine (LIDODERM) 5 % Place 1 patch onto the skin daily. Remove & Discard patch within 12 hours or as directed by MD 03/05/23  Yes Rozelia Catapano, Noberto Retort, PA-C  lipase/protease/amylase (CREON) 12000-38000 units CPEP capsule Take by mouth daily.   Yes [provider]  methocarbamol (ROBAXIN) 500 MG tablet Take 1 tablet (500 mg total) by mouth 2 (two) times daily. 03/05/23  Yes Edan Juday K, PA-C  naproxen (NAPROSYN) 375 MG tablet Take 1 tablet (375 mg total) by mouth 2 (two) times daily. 03/05/23  Yes Kavi Almquist, Denny Peon  K, PA-C  Semaglutide, 1 MG/DOSE, (OZEMPIC, 1 MG/DOSE,) 2 MG/1.5ML SOPN Inject into the skin once a week.   Yes [provider]  allopurinol (ZYLOPRIM) 300 MG tablet TAKE ONE TABLET BY MOUTH DAILY 03/07/22   Worthy Rancher B, FNP  blood glucose meter kit and supplies KIT Dispense based on patient and insurance preference.  Check glucose fasting daily (ICD 10 code E11.9). 12/10/18   Glori Luis, MD  colchicine 0.6 MG tablet Take 1.2 mg (2 tablets) PO at the first sign of flare,  followed in 1 hour with a single dose of 0.6 mg (1 tablet) PO, starting the next day take 0.6 mg (1 tablet) PO daily until gout flare has resolved 01/08/21   Glori Luis, MD  OneTouch Delica Lancets 33G MISC  03/15/19   [provider]  [facility] VERIO test strip  03/15/19   [provider]    Family History Family History  Problem Relation Age of Onset   Breast cancer Mother    Diabetes Mother    Hypertension Mother    Brain cancer Mother    Arthritis Maternal Grandmother    Hypertension Maternal Grandmother    Diabetes Maternal Grandmother    Lung cancer Paternal Grandmother    Prostate cancer Paternal Uncle    Colon cancer Neg Hx    Esophageal cancer Neg Hx    Pancreatic cancer Neg Hx    Rectal cancer Neg Hx    Stomach cancer Neg Hx     Social History Social History   Tobacco Use   Smoking status: Never   Smokeless tobacco: Never  Vaping Use   Vaping status: Never Used  Substance Use Topics   Alcohol use: Yes    Alcohol/week: 0.0 - 1.0 standard drinks of alcohol    Comment: rarely   Drug use: No     Allergies   Patient has no known allergies.   Review of Systems Review of Systems  Constitutional:  Positive for activity change. Negative for appetite change, fatigue and fever.  Gastrointestinal:  Negative for abdominal pain, diarrhea, nausea and vomiting.  Musculoskeletal:  Positive for arthralgias and gait problem. Negative for joint swelling and myalgias.  Neurological:  Negative for weakness and numbness.     Physical Exam Triage Vital Signs ED Triage Vitals  Encounter Vitals Group     BP 03/05/23 1115 126/79     Systolic BP Percentile --      Diastolic BP Percentile --      Pulse Rate 03/05/23 1115 63     Resp 03/05/23 1115 20     Temp 03/05/23 1115 97.9 F (36.6 C)     Temp Source 03/05/23 1115 Oral     SpO2 03/05/23 1115 96 %     Weight --      Height --      Head Circumference --      Peak Flow --      Pain Score  03/05/23 1110 9     Pain Loc --      Pain Education --      Exclude from Growth Chart --    No data found.  Updated Vital Signs BP 126/79 (BP Location: Right Arm)   Pulse 63   Temp 97.9 F (36.6 C) (Oral)   Resp 20   SpO2 96%   Visual Acuity Right Eye Distance:   Left Eye Distance:   Bilateral Distance:    Right Eye Near:   Left Eye  Near:    Bilateral Near:     Physical Exam Vitals reviewed.  Constitutional:      General: He is awake.     Appearance: Normal appearance. He is well-developed. He is not ill-appearing.     Comments: Very pleasant male appears stated age in no acute distress sitting comfortably in exam room  HENT:     Head: Normocephalic and atraumatic.  Cardiovascular:     Rate and Rhythm: Normal rate and regular rhythm.     Heart sounds: Normal heart sounds, S1 normal and S2 normal. No murmur heard. Pulmonary:     Effort: Pulmonary effort is normal.     Breath sounds: Normal breath sounds. No stridor. No wheezing, rhonchi or rales.     Comments: Clear to auscultation bilaterally Abdominal:     Palpations: Abdomen is soft.     Tenderness: There is no abdominal tenderness.  Musculoskeletal:     Cervical back: No tenderness or bony tenderness.     Thoracic back: No tenderness or bony tenderness.     Lumbar back: No tenderness or bony tenderness.     Right hip: No deformity, tenderness or bony tenderness. Normal range of motion.     Comments: Right hip: No tenderness palpation over major joint.  Mild tenderness over right buttocks musculature.  Strength 5/5 bilateral lower extremities.  Normal active range of motion.  Neurological:     Mental Status: He is alert.  Psychiatric:        Behavior: Behavior is cooperative.      UC Treatments / Results  Labs (all labs ordered are listed, but only abnormal results are displayed) Labs Reviewed - No data to display  EKG   Radiology No results found.  Procedures Procedures (including critical care  time)  Medications Ordered in UC Medications - No data to display  Initial Impression / Assessment and Plan / UC Course  I have reviewed the triage vital signs and the nursing notes.  Pertinent labs & imaging results that were available during my care of the patient were reviewed by me and considered in my medical decision making (see chart for details).     Patient is well-appearing, afebrile, nontoxic, nontachycardic.  Plain films were deferred as patient denies any recent trauma and has no focal bony tenderness.  I am concerned for piriformis muscle pain as etiology of symptoms given clinical presentation.  He was started on Naprosyn twice daily with instruction to take additional NSAIDs with this medication due to risk of GI bleeding.  Will also use muscle relaxers and Robaxin were sent to pharmacy.  We discussed that this can be sedating and he is not to drive or drink alcohol while on this medication.  He was also given lidocaine patches for additional symptom relief.  Recommended heat and gentle stretch.  Discussed that if symptoms not improving quickly he should follow-up with primary care to consider referral to physical therapy or sports medicine.  If anything worsens or changes he is to return for reevaluation.  Strict return precautions given.  Work excuse note provided.  Final Clinical Impressions(s) / UC Diagnoses   Final diagnoses:  Piriformis muscle pain     Discharge Instructions      I am concerned that you have aggravated your piriformis muscle.  Do not put your wallet in your back pocket.  Please start Naprosyn twice a day.  Do not take other NSAIDs with this medication including aspirin, ibuprofen/Advil, naproxen/Aleve.  You can use acetaminophen/Tylenol  for breakthrough pain.  Take Robaxin up to 2 times a day.  This will make you sleepy so do not drive or drink alcohol taking it.  Apply lidocaine patch for 12 hours during the day; remove this for 12 hours at night.  Use  only 1 patch per 24 hours.  I recommend you follow-up with sports medicine; call to schedule an appointment.  If anything worsens and you have severe pain, going to the bathroom on yourself without noticing it, numbness or tingling in your legs, weakness in your legs you need to be seen immediately.      ED Prescriptions     Medication Sig Dispense Auth. Provider   naproxen (NAPROSYN) 375 MG tablet Take 1 tablet (375 mg total) by mouth 2 (two) times daily. 20 tablet Pistol Kessenich K, PA-C   methocarbamol (ROBAXIN) 500 MG tablet Take 1 tablet (500 mg total) by mouth 2 (two) times daily. 20 tablet Kayloni Rocco K, PA-C   lidocaine (LIDODERM) 5 % Place 1 patch onto the skin daily. Remove & Discard patch within 12 hours or as directed by MD 10 patch Royce Sciara, Noberto Retort, PA-C      PDMP not reviewed this encounter.   Jeani Hawking, PA-C 03/05/23 1145

## 2023-03-05 NOTE — ED Triage Notes (Signed)
Pt reports right hip pain x2 weeks. Denies any known injury or history of similar. Reports has been taking otc medication with no change in symptoms. Denies any gi/gu symptoms, numbness in extremity.

## 2023-06-12 ENCOUNTER — Other Ambulatory Visit: Payer: Self-pay | Admitting: Family

## 2023-09-11 ENCOUNTER — Telehealth: Payer: Self-pay | Admitting: Family Medicine

## 2023-09-11 NOTE — Telephone Encounter (Signed)
 Dr Birdie Sons is no longer at this location. Please call the office to schedule a Transfer of Care to either Dr Charlann Lange, Darleen Crocker or Kara Dies, NP. Purvis Sheffield, please schedule a TOC visit for this patient. [facility]   Thank you

## 2024-08-28 ENCOUNTER — Ambulatory Visit: Admitting: Physician Assistant
